# Patient Record
Sex: Female | Born: 1990 | Hispanic: No | Marital: Single | Smoking: Never smoker
Health system: Southern US, Community
[De-identification: ages and names within clinical notes are randomized; demographics above are authoritative.]

## PROBLEM LIST (undated history)

## (undated) ENCOUNTER — Inpatient Hospital Stay (HOSPITAL_COMMUNITY): Payer: Self-pay

## (undated) DIAGNOSIS — R519 Headache, unspecified: Secondary | ICD-10-CM

## (undated) DIAGNOSIS — J45909 Unspecified asthma, uncomplicated: Secondary | ICD-10-CM

## (undated) DIAGNOSIS — R51 Headache: Secondary | ICD-10-CM

## (undated) HISTORY — DX: Headache, unspecified: R51.9

## (undated) HISTORY — DX: Headache: R51

## (undated) HISTORY — PX: NO PAST SURGERIES: SHX2092

---

## 2014-12-02 LAB — OB RESULTS CONSOLE ABO/RH: RH Type: POSITIVE

## 2014-12-02 LAB — OB RESULTS CONSOLE GC/CHLAMYDIA
Chlamydia: NEGATIVE
Gonorrhea: NEGATIVE

## 2014-12-02 LAB — OB RESULTS CONSOLE HEPATITIS B SURFACE ANTIGEN: HEP B S AG: NEGATIVE

## 2014-12-02 LAB — OB RESULTS CONSOLE RUBELLA ANTIBODY, IGM: Rubella: IMMUNE

## 2014-12-02 LAB — OB RESULTS CONSOLE HIV ANTIBODY (ROUTINE TESTING): HIV: NONREACTIVE

## 2014-12-02 LAB — OB RESULTS CONSOLE ANTIBODY SCREEN: Antibody Screen: NEGATIVE

## 2014-12-02 LAB — OB RESULTS CONSOLE RPR: RPR: NONREACTIVE

## 2014-12-03 ENCOUNTER — Encounter (HOSPITAL_COMMUNITY): Payer: Self-pay | Admitting: *Deleted

## 2014-12-03 ENCOUNTER — Inpatient Hospital Stay (HOSPITAL_COMMUNITY)
Admission: AD | Admit: 2014-12-03 | Discharge: 2014-12-03 | Disposition: A | Discharge status: Home / Self Care | Payer: Medicaid Other | Source: Ambulatory Visit | Attending: Obstetrics & Gynecology | Admitting: Obstetrics & Gynecology

## 2014-12-03 DIAGNOSIS — N949 Unspecified condition associated with female genital organs and menstrual cycle: Secondary | ICD-10-CM

## 2014-12-03 DIAGNOSIS — O26892 Other specified pregnancy related conditions, second trimester: Secondary | ICD-10-CM | POA: Diagnosis not present

## 2014-12-03 DIAGNOSIS — Z3A15 15 weeks gestation of pregnancy: Secondary | ICD-10-CM | POA: Insufficient documentation

## 2014-12-03 DIAGNOSIS — M549 Dorsalgia, unspecified: Secondary | ICD-10-CM | POA: Diagnosis present

## 2014-12-03 DIAGNOSIS — R102 Pelvic and perineal pain: Secondary | ICD-10-CM | POA: Diagnosis not present

## 2014-12-03 DIAGNOSIS — R1030 Lower abdominal pain, unspecified: Secondary | ICD-10-CM | POA: Insufficient documentation

## 2014-12-03 HISTORY — DX: Unspecified asthma, uncomplicated: J45.909

## 2014-12-03 LAB — URINALYSIS, ROUTINE W REFLEX MICROSCOPIC
BILIRUBIN URINE: NEGATIVE
Glucose, UA: NEGATIVE mg/dL
Hgb urine dipstick: NEGATIVE
Ketones, ur: NEGATIVE mg/dL
Leukocytes, UA: NEGATIVE
NITRITE: NEGATIVE
PH: 5.5 (ref 5.0–8.0)
Protein, ur: NEGATIVE mg/dL
SPECIFIC GRAVITY, URINE: 1.01 (ref 1.005–1.030)
Urobilinogen, UA: 0.2 mg/dL (ref 0.0–1.0)

## 2014-12-03 LAB — COMPREHENSIVE METABOLIC PANEL
ALK PHOS: 45 U/L (ref 38–126)
ALT: 16 U/L (ref 14–54)
AST: 24 U/L (ref 15–41)
Albumin: 3.7 g/dL (ref 3.5–5.0)
Anion gap: 6 (ref 5–15)
BILIRUBIN TOTAL: 0.5 mg/dL (ref 0.3–1.2)
BUN: 8 mg/dL (ref 6–20)
CALCIUM: 9.3 mg/dL (ref 8.9–10.3)
CO2: 24 mmol/L (ref 22–32)
CREATININE: 0.47 mg/dL (ref 0.44–1.00)
Chloride: 105 mmol/L (ref 101–111)
Glucose, Bld: 100 mg/dL — ABNORMAL HIGH (ref 65–99)
Potassium: 3.7 mmol/L (ref 3.5–5.1)
Sodium: 135 mmol/L (ref 135–145)
TOTAL PROTEIN: 7.6 g/dL (ref 6.5–8.1)

## 2014-12-03 LAB — CBC
HCT: 30.8 % — ABNORMAL LOW (ref 36.0–46.0)
HEMOGLOBIN: 10.9 g/dL — AB (ref 12.0–15.0)
MCH: 31.3 pg (ref 26.0–34.0)
MCHC: 35.4 g/dL (ref 30.0–36.0)
MCV: 88.5 fL (ref 78.0–100.0)
PLATELETS: 258 10*3/uL (ref 150–400)
RBC: 3.48 MIL/uL — AB (ref 3.87–5.11)
RDW: 13.1 % (ref 11.5–15.5)
WBC: 9.7 10*3/uL (ref 4.0–10.5)

## 2014-12-03 LAB — AMYLASE: AMYLASE: 66 U/L (ref 28–100)

## 2014-12-03 LAB — LIPASE, BLOOD: LIPASE: 26 U/L (ref 22–51)

## 2014-12-03 MED ORDER — ACETAMINOPHEN 500 MG PO TABS
1000.0000 mg | ORAL_TABLET | Freq: Once | ORAL | Status: AC
  Administered 2014-12-03: 1000 mg via ORAL
  Filled 2014-12-03: qty 2

## 2014-12-03 NOTE — Discharge Instructions (Signed)

## 2014-12-03 NOTE — MAU Note (Addendum)
PT SAYS SHE WENT  [facility]  FOR POSITIVE  TEST.     EDC- BASED ON CYCLE -   05-30-2015.    CRAMPING STARTED  LAST NIGHT - LOWER ABD  AND RADIATE  INTO LEFT RIB CAGE-   TOOK 2 XS TYLENOL -    FELL ASLEEP.      THEN NO PAIN      THEN  THIS AM  - NAUSEA.  ALL DAY  ABD  TIGHT .     THEN AT 7PM TONIGHT  PAIN RADIATE  TO  RIGHT  RIB CAGE.  THEN   RADIATE  TO SPINE.          WAS SEEN  AT HD IN [place]   YESTERDAY- ALL OK .  THEN RETURNS ON Monday- .    LAST SEX-  TODAY 5PM-  NO PAIN      SAYS VOMITED  X2  TONIGHT.     FEELS LIKE HEART   IS FLUTTERING

## 2014-12-03 NOTE — MAU Provider Note (Signed)
History     CSN: [id number]  Arrival date and time: 12/03/14 2124   First Provider Initiated Contact with Patient 12/03/14 2206      Chief Complaint  Patient presents with  . Abdominal Cramping   HPI  Ms. Margaret Logan is a 24 y.o. G3P1011 at [redacted]w[redacted]d who presents to MAU today with complaint of abdominal and back pain since last night. She states that she took Tylenol yesterday which helped somewhat and she was able to sleep. This morning she felt mild discomfort, but feels that the pain is worse tonight. She states lower abdominal pain that radiates to the RUQ with sharp pains. She also states low back pain that radiates to the mid-thoracic region, worse on the right then left. She rates pain at 6/10 now. She has not taken anything for pain today. She denies complications with this or previous pregnancies. She denies vaginal bleeding, diarrhea, constipation, fever or UTI symptoms. She has had intermittent N/V which as improved since [redacted] weeks GA. She is not nauseous currently. She has just recently started prenatal care with [facility] in [facility].   OB History    Gravida Para Term Preterm AB TAB SAB Ectopic Multiple Living   Past Medical History  Diagnosis Date  . Asthma     uses an inhaler prn, last use about a year ago    Past Surgical History  Procedure Laterality Date  . No past surgeries      No family history on file.  Social History  Substance Use Topics  . Smoking status: None  . Smokeless tobacco: None  . Alcohol Use: None    Allergies:  Allergies  Allergen Reactions  . Penicillins Nausea And Vomiting  . Latex Swelling and Rash    No prescriptions prior to admission    Review of Systems  Constitutional: Negative for fever and malaise/fatigue.  Gastrointestinal: Positive for nausea, vomiting and abdominal pain. Negative for diarrhea and constipation.  Genitourinary: Negative for dysuria, urgency and frequency.       Neg - vaginal  bleeding, abnormal discharge   Physical Exam   Blood pressure 132/71, pulse 111, temperature 99.2 F (37.3 C), temperature source Oral, height  (1.6 m), weight 157 lb (71.215 kg), last menstrual period 08/14/2014, SpO2 100 %.  Physical Exam  Nursing note and vitals reviewed. Constitutional: She is oriented to person, place, and time. She appears well-developed and well-nourished. No distress.  HENT:  Head: Normocephalic and atraumatic.  Cardiovascular: Normal rate.   Respiratory: Effort normal.  GI: Soft. Bowel sounds are normal. She exhibits no distension and no mass. There is no tenderness. There is no rebound and no guarding.  Neurological: She is alert and oriented to person, place, and time.  Skin: Skin is warm and dry. No erythema.  Psychiatric: She has a normal mood and affect.  Dilation: Closed Effacement (%): Thick Cervical Position: Posterior Exam by:: Vonzella Nipple PA  Results for orders placed or performed during the hospital encounter of 12/03/14 (from the past 24 hour(s))  Urinalysis, Routine w reflex microscopic (not at [facility])     Status: None   Collection Time: 12/03/14  9:30 PM  Result Value Ref Range   Color, Urine YELLOW YELLOW   APPearance CLEAR CLEAR   Specific Gravity, Urine 1.010 1.005 - 1.030   pH 5.5 5.0 - 8.0   Glucose, UA NEGATIVE NEGATIVE mg/dL   Hgb  urine dipstick NEGATIVE NEGATIVE   Bilirubin Urine NEGATIVE NEGATIVE   Ketones, ur NEGATIVE NEGATIVE mg/dL   Protein, ur NEGATIVE NEGATIVE mg/dL   Urobilinogen, UA 0.2 0.0 - 1.0 mg/dL   Nitrite NEGATIVE NEGATIVE   Leukocytes, UA NEGATIVE NEGATIVE    MAU Course  Procedures None  MDM FHR - 149 bpm with doppler UA, CBC and CMP today Tylenol 1000 mg for pain given in MAU. Patient declines use of anything stronger than Tylenol.  Medication for N/V offered. Patient declines.  Assessment and Plan  A: SIUP at [redacted]w[redacted]d Round ligament pain Back pain in pregnancy, second trimester  P: Discharge  home Tylenol PRN for pain advised Second trimester warning signs discussed Discussed use of warm bath/shower and abdominal binder for pain Patient advised to follow-up with [facility] [place] as scheduled for routine prenatal care Patient may return to MAU as needed or if her condition were to change or worsen   Marny Lowenstein, PA-C  12/03/2014, 10:06 PM

## 2014-12-23 ENCOUNTER — Other Ambulatory Visit (HOSPITAL_COMMUNITY): Payer: Self-pay | Admitting: Obstetrics and Gynecology

## 2014-12-23 DIAGNOSIS — Z3689 Encounter for other specified antenatal screening: Secondary | ICD-10-CM

## 2014-12-24 ENCOUNTER — Ambulatory Visit (HOSPITAL_COMMUNITY)
Admission: RE | Admit: 2014-12-24 | Discharge: 2014-12-24 | Disposition: A | Discharge status: Home / Self Care | Payer: Medicaid Other | Source: Ambulatory Visit | Attending: Obstetrics and Gynecology | Admitting: Obstetrics and Gynecology

## 2014-12-24 ENCOUNTER — Other Ambulatory Visit (HOSPITAL_COMMUNITY): Payer: Self-pay | Admitting: Obstetrics and Gynecology

## 2014-12-24 DIAGNOSIS — Z3A18 18 weeks gestation of pregnancy: Secondary | ICD-10-CM | POA: Diagnosis not present

## 2014-12-24 DIAGNOSIS — Z3689 Encounter for other specified antenatal screening: Secondary | ICD-10-CM

## 2014-12-24 DIAGNOSIS — Z36 Encounter for antenatal screening of mother: Secondary | ICD-10-CM | POA: Diagnosis not present

## 2015-02-11 ENCOUNTER — Other Ambulatory Visit (HOSPITAL_COMMUNITY): Payer: Self-pay | Admitting: Nurse Practitioner

## 2015-02-11 DIAGNOSIS — Z3689 Encounter for other specified antenatal screening: Secondary | ICD-10-CM

## 2015-03-02 NOTE — L&D Delivery Note (Signed)
Delivery Note 2340: Nurse call reports patient C/C/+2 with urge to push.  In room to assess and patient states "I am ready."  FHR reassuring and neonatal team at bedside for MSAF and maternal fever.  Patient delivered as below with staff and FOB support.   At 12:04 AM, on May 29, 2015, a viable female "Margaret Logan" was delivered via  (Presentation: Left Occiput Anterior with manual restitution to LOT). After delivery of head a shoulder dystocia was noted and McRoberts and Suprapubric pressure maneuvers were implemented.  After ~2 minutes of attempting these maneuvers, with adjustments to suprapubic pressure and attempts at delivery of posterior arm, assistance was called.  Dr. Sherre Scarlet. Harraway-Smith in room  and at perineum ~30 seconds later. McRoberts and suprapubic given per her instructions with release of anterior shoulder at ~3 minutes from delivery of head.  After release of shoulder, care reassumed, by this provider, and body delivered.  Infant placed on mother's abdomen where cord was immediately cut and infant handed to Dr. Laverta BaltimoreB. Rattray for assessment.  Infant APGAR: 2,9. Provider then discussed shoulder dystocia incident with patient and SO to their satisfaction, addressing all questions and concerns.  Cord pH and blood collected and sent. Placenta delivered spontaneously and noted to be intact with 3VC upon inspection.  Vaginal inspection revealed no lacerations.  Fundus firm, at the umbilicus, and bleeding small.  Mother hemodynamically stable and infant skin to skin prior to provider exit.  Mother unsure of birth control method and opts to breastfeed.  Infant weight at one hour of life: 8lbs 2.9oz, 20.5in    Results for Margaret CoyBROOKS, GIRL Margaret (MRN [id number])   Ref. Range 05/29/2015 00:05  pH cord blood Unknown 7.353  pCO2 cord blood Latest Units: mmHg 40.5  Bicarbonate Latest Ref Range: 20.0-24.0 mEq/L 21.9  TCO2 Latest Ref Range: 0-100 mmol/L 23.2  Acid-base deficit Latest Ref Range: 0.0-2.0 mmol/L 2.9  (H)   Anesthesia: Epidural  Episiotomy: None Lacerations: None Suture Repair: None Est. Blood Loss (mL): 200  Mom to postpartum.  Baby to Couplet care / Skin to Skin.  Cherre RobinsJessica L Esmae Donathan MSN, CNM 05/29/2015, 12:37 AM

## 2015-03-11 ENCOUNTER — Ambulatory Visit (HOSPITAL_COMMUNITY)
Admission: RE | Admit: 2015-03-11 | Discharge: 2015-03-11 | Disposition: A | Discharge status: Home / Self Care | Payer: Medicaid Other | Source: Ambulatory Visit | Attending: Nurse Practitioner | Admitting: Nurse Practitioner

## 2015-03-11 ENCOUNTER — Other Ambulatory Visit (HOSPITAL_COMMUNITY): Payer: Self-pay | Admitting: Nurse Practitioner

## 2015-03-11 DIAGNOSIS — O359XX0 Maternal care for (suspected) fetal abnormality and damage, unspecified, not applicable or unspecified: Secondary | ICD-10-CM | POA: Insufficient documentation

## 2015-03-11 DIAGNOSIS — Z3A29 29 weeks gestation of pregnancy: Secondary | ICD-10-CM | POA: Insufficient documentation

## 2015-03-11 DIAGNOSIS — Z3689 Encounter for other specified antenatal screening: Secondary | ICD-10-CM

## 2015-05-26 ENCOUNTER — Encounter (HOSPITAL_COMMUNITY): Payer: Self-pay | Admitting: *Deleted

## 2015-05-26 ENCOUNTER — Telehealth (HOSPITAL_COMMUNITY): Payer: Self-pay | Admitting: *Deleted

## 2015-05-26 LAB — OB RESULTS CONSOLE GBS: GBS: NEGATIVE

## 2015-05-26 NOTE — Telephone Encounter (Signed)
Preadmission screen  

## 2015-05-27 ENCOUNTER — Other Ambulatory Visit: Payer: Self-pay | Admitting: Obstetrics and Gynecology

## 2015-05-28 ENCOUNTER — Encounter (HOSPITAL_COMMUNITY): Payer: Self-pay

## 2015-05-28 ENCOUNTER — Inpatient Hospital Stay (HOSPITAL_COMMUNITY): Payer: Medicaid Other | Admitting: Anesthesiology

## 2015-05-28 ENCOUNTER — Inpatient Hospital Stay (HOSPITAL_COMMUNITY)
Admission: RE | Admit: 2015-05-28 | Discharge: 2015-05-31 | DRG: 774 | Disposition: A | Discharge status: Home / Self Care | Payer: Medicaid Other | Source: Ambulatory Visit | Attending: Obstetrics and Gynecology | Admitting: Obstetrics and Gynecology

## 2015-05-28 DIAGNOSIS — Z8249 Family history of ischemic heart disease and other diseases of the circulatory system: Secondary | ICD-10-CM

## 2015-05-28 DIAGNOSIS — J45909 Unspecified asthma, uncomplicated: Secondary | ICD-10-CM | POA: Diagnosis present

## 2015-05-28 DIAGNOSIS — O358XX0 Maternal care for other (suspected) fetal abnormality and damage, not applicable or unspecified: Secondary | ICD-10-CM | POA: Diagnosis present

## 2015-05-28 DIAGNOSIS — Z91040 Latex allergy status: Secondary | ICD-10-CM

## 2015-05-28 DIAGNOSIS — Z3A41 41 weeks gestation of pregnancy: Secondary | ICD-10-CM

## 2015-05-28 DIAGNOSIS — O9952 Diseases of the respiratory system complicating childbirth: Secondary | ICD-10-CM | POA: Diagnosis present

## 2015-05-28 DIAGNOSIS — Z880 Allergy status to penicillin: Secondary | ICD-10-CM | POA: Diagnosis not present

## 2015-05-28 DIAGNOSIS — O480 Post-term pregnancy: Secondary | ICD-10-CM | POA: Diagnosis present

## 2015-05-28 DIAGNOSIS — O9081 Anemia of the puerperium: Secondary | ICD-10-CM | POA: Diagnosis not present

## 2015-05-28 DIAGNOSIS — O35EXX0 Maternal care for other (suspected) fetal abnormality and damage, fetal genitourinary anomalies, not applicable or unspecified: Secondary | ICD-10-CM

## 2015-05-28 DIAGNOSIS — D62 Acute posthemorrhagic anemia: Secondary | ICD-10-CM | POA: Diagnosis not present

## 2015-05-28 LAB — CBC
HCT: 34.5 % — ABNORMAL LOW (ref 36.0–46.0)
Hemoglobin: 11.4 g/dL — ABNORMAL LOW (ref 12.0–15.0)
MCH: 27.1 pg (ref 26.0–34.0)
MCHC: 33 g/dL (ref 30.0–36.0)
MCV: 81.9 fL (ref 78.0–100.0)
PLATELETS: 244 10*3/uL (ref 150–400)
RBC: 4.21 MIL/uL (ref 3.87–5.11)
RDW: 14.8 % (ref 11.5–15.5)
WBC: 10.3 10*3/uL (ref 4.0–10.5)

## 2015-05-28 LAB — RAPID HIV SCREEN (HIV 1/2 AB+AG)
HIV 1/2 Antibodies: NONREACTIVE
HIV-1 P24 ANTIGEN - HIV24: NONREACTIVE

## 2015-05-28 LAB — TYPE AND SCREEN
ABO/RH(D): A POS
Antibody Screen: NEGATIVE

## 2015-05-28 LAB — ABO/RH: ABO/RH(D): A POS

## 2015-05-28 LAB — RPR: RPR Ser Ql: NONREACTIVE

## 2015-05-28 MED ORDER — OXYTOCIN 10 UNIT/ML IJ SOLN
2.5000 [IU]/h | INTRAVENOUS | Status: DC
  Filled 2015-05-28: qty 10

## 2015-05-28 MED ORDER — TERBUTALINE SULFATE 1 MG/ML IJ SOLN
0.2500 mg | Freq: Once | INTRAMUSCULAR | Status: DC | PRN
  Filled 2015-05-28: qty 1

## 2015-05-28 MED ORDER — PHENYLEPHRINE 40 MCG/ML (10ML) SYRINGE FOR IV PUSH (FOR BLOOD PRESSURE SUPPORT)
80.0000 ug | PREFILLED_SYRINGE | INTRAVENOUS | Status: DC | PRN
  Filled 2015-05-28: qty 2

## 2015-05-28 MED ORDER — DIPHENHYDRAMINE HCL 50 MG/ML IJ SOLN: 12.5000 mg | INTRAMUSCULAR | Status: DC | PRN

## 2015-05-28 MED ORDER — ONDANSETRON HCL 4 MG/2ML IJ SOLN: 4.0000 mg | Freq: Four times a day (QID) | INTRAMUSCULAR | Status: DC | PRN

## 2015-05-28 MED ORDER — CITRIC ACID-SODIUM CITRATE 334-500 MG/5ML PO SOLN: 30.0000 mL | ORAL | Status: DC | PRN

## 2015-05-28 MED ORDER — LACTATED RINGERS IV SOLN: 500.0000 mL | Freq: Once | INTRAVENOUS | Status: DC

## 2015-05-28 MED ORDER — LACTATED RINGERS IV SOLN
INTRAVENOUS | Status: DC
  Administered 2015-05-28: 300 mL via INTRAUTERINE

## 2015-05-28 MED ORDER — OXYTOCIN BOLUS FROM INFUSION: 500.0000 mL | INTRAVENOUS | Status: DC

## 2015-05-28 MED ORDER — MISOPROSTOL 25 MCG QUARTER TABLET
25.0000 ug | ORAL_TABLET | ORAL | Status: DC | PRN
  Filled 2015-05-28: qty 1
  Filled 2015-05-28: qty 0.25

## 2015-05-28 MED ORDER — LACTATED RINGERS IV SOLN: 500.0000 mL | INTRAVENOUS | Status: DC | PRN

## 2015-05-28 MED ORDER — LIDOCAINE HCL (PF) 1 % IJ SOLN
INTRAMUSCULAR | Status: DC | PRN
  Administered 2015-05-28: 3 mL
  Administered 2015-05-28: 2 mL via EPIDURAL
  Administered 2015-05-28: 5 mL

## 2015-05-28 MED ORDER — EPHEDRINE 5 MG/ML INJ: 10.0000 mg | INTRAVENOUS | Status: DC | PRN

## 2015-05-28 MED ORDER — LACTATED RINGERS IV SOLN
INTRAVENOUS | Status: DC
  Administered 2015-05-28: 18:00:00 via INTRAVENOUS
  Administered 2015-05-28: 125 mL/h via INTRAVENOUS

## 2015-05-28 MED ORDER — SODIUM CHLORIDE 0.9 % IV SOLN
3.0000 g | Freq: Four times a day (QID) | INTRAVENOUS | Status: DC
  Administered 2015-05-28: 3 g via INTRAVENOUS
  Filled 2015-05-28 (×3): qty 3

## 2015-05-28 MED ORDER — EPHEDRINE 5 MG/ML INJ
10.0000 mg | INTRAVENOUS | Status: DC | PRN
  Filled 2015-05-28: qty 2

## 2015-05-28 MED ORDER — LIDOCAINE HCL (PF) 1 % IJ SOLN
30.0000 mL | INTRAMUSCULAR | Status: DC | PRN
  Filled 2015-05-28: qty 30

## 2015-05-28 MED ORDER — FENTANYL 2.5 MCG/ML BUPIVACAINE 1/10 % EPIDURAL INFUSION (WH - ANES)
14.0000 mL/h | INTRAMUSCULAR | Status: DC | PRN
  Administered 2015-05-28 (×2): 14 mL/h via EPIDURAL
  Filled 2015-05-28 (×2): qty 125

## 2015-05-28 MED ORDER — PHENYLEPHRINE 40 MCG/ML (10ML) SYRINGE FOR IV PUSH (FOR BLOOD PRESSURE SUPPORT): 80.0000 ug | PREFILLED_SYRINGE | INTRAVENOUS | Status: DC | PRN

## 2015-05-28 MED ORDER — ACETAMINOPHEN 325 MG PO TABS: 650.0000 mg | ORAL_TABLET | ORAL | Status: DC | PRN

## 2015-05-28 MED ORDER — PHENYLEPHRINE 40 MCG/ML (10ML) SYRINGE FOR IV PUSH (FOR BLOOD PRESSURE SUPPORT)
80.0000 ug | PREFILLED_SYRINGE | INTRAVENOUS | Status: DC | PRN
  Filled 2015-05-28: qty 2
  Filled 2015-05-28: qty 20

## 2015-05-28 MED ORDER — OXYTOCIN 10 UNIT/ML IJ SOLN
1.0000 m[IU]/min | INTRAVENOUS | Status: DC
  Administered 2015-05-28: 2 m[IU]/min via INTRAVENOUS

## 2015-05-28 MED ORDER — ACETAMINOPHEN 500 MG PO TABS
1000.0000 mg | ORAL_TABLET | Freq: Once | ORAL | Status: AC
  Administered 2015-05-28: 1000 mg via ORAL
  Filled 2015-05-28: qty 2

## 2015-05-28 MED ORDER — LACTATED RINGERS IV SOLN
500.0000 mL | Freq: Once | INTRAVENOUS | Status: AC
  Administered 2015-05-28: 1000 mL via INTRAVENOUS

## 2015-05-28 MED ORDER — FENTANYL CITRATE (PF) 100 MCG/2ML IJ SOLN
50.0000 ug | INTRAMUSCULAR | Status: DC | PRN
  Administered 2015-05-28: 50 ug via INTRAVENOUS
  Filled 2015-05-28: qty 2

## 2015-05-28 MED ORDER — TERBUTALINE SULFATE 1 MG/ML IJ SOLN: 0.2500 mg | Freq: Once | INTRAMUSCULAR | Status: DC | PRN

## 2015-05-28 NOTE — Progress Notes (Signed)
Dr Kern Albertaurks assessing epidural and to redose pt as needed.

## 2015-05-28 NOTE — Progress Notes (Signed)
Keane PoliceHailey Insalaco [id number]  Subjective: Strip and Chart Reviewed.  Objective:  Filed Vitals:   05/28/15 0509 05/28/15 0531 05/28/15 0601 05/28/15 0639  BP: 127/82 129/70 106/58 126/82  Pulse: 95 73 95 91  Temp: 98.8 F (37.1 C)     TempSrc: Oral     Resp:      Height:      Weight:        FHR: 125 bpm, Mod Var, -Decels, +Accels UC: Q4-415min  Assessment: IUP at 6763w0d Cat I FT Post Dates IOL; Foley Bulb and Pitocin  Plan: -Continue present mgmt -Dr. ND updated -Report to be given to V.Standard, CNM  Sabas SousJ. Markesia Crilly, CNM 05/28/2015 6:49 AM

## 2015-05-28 NOTE — Progress Notes (Addendum)
Margaret Logan MRN: [id number]  Subjective: -Care assumed.  Patient resting in bed.  Reports fatigue.  No rectal pressure. Nurse reports patient with increasing temp.  FOB remains at bedside, supportive.   Objective: BP 94/53 mmHg  Pulse 122  Temp(Src) 99.4 F (37.4 C) (Oral)  Resp 18  Ht 5' 3.5" (1.613 m)  Wt 88.451 kg (195 lb)  BMI 34.00 kg/m2  SpO2 100%  LMP 08/14/2014      Fetal Monitoring: FHT: 155 bpm, Min Var, -Decels, -Accels UC: Q3-414min    Vaginal Exam: SVE:   Dilation: 8 Effacement (%): 80 Station: -1 Exam by:: Margaret Logan CNM Membranes:SROM at 1600 Internal Monitors: IUPC inserted without difficulty  Augmentation/Induction: Pitocin:6912mUn/min  Cytotec: None S/P Foley Bulb  Assessment:  IUP at 41wks Cat I FT Pitocin Induction Progressive Labor Mild Fever  Plan: -Discussed IUPC r/b, prior to insertion, including increased risk of infection and ability to adequately monitor quantity and strength of contractions. -Start amnioinfusion at 300/150 to promote variability -Give 1gram tylenol po now -Position change to promote fetal descent and rotation -Continue other mgmt as ordered  Margaret CavaJessica L Daesean Lazarz,MSN, CNM 05/28/2015, 8:10 PM   Addendum 2110 Patient reports feeling better Temp 100.8 Maternal Fever Start Unasyn x 24 hrs Margaret Logan updated on patient status  Margaret Logan 9:12 PM

## 2015-05-28 NOTE — Progress Notes (Addendum)
Labor Progress  Subjective: Managing ctx with nitrous. Reports its no longer working ask for epidural  Objective: BP 127/64 mmHg  Pulse 84  Temp(Src) 97.9 F (36.6 C) (Oral)  Resp 18  Ht 5' 3.5" (1.613 m)  Wt 195 lb (88.451 kg)  BMI 34.00 kg/m2  LMP 08/14/2014     FHT: 135, moderate variability + accel, occasional variable  decel CTX:  regular, every 3-4 minutes Uterus gravid, soft non tender SVE:  Dilation: 5.5 Effacement (%): 80 Station: -2 Exam by:: Stclair Szymborski CNM Pitocin at 688mUn/min  Assessment:  IUP at 41.0 weeks IOL d/t PD NICHD: Category 2 Membranes:  BBW  Induction:   Cytotec xn/a  Foley Bulb: out 0640  Pitocin - 8mu  Pain management:  IV pain management: n/a  Nitrous: yes Epidural placement: request GBS neg  Plan: Continue labor plan Continuous monitoring Frequent position changes to facilitate fetal rotation and descent. Will reassess with cervical exam at 1600 or earlier if necessary Possible AROM at next check Continue pitocin per protocol      Margaret Logan, CNM, MSN 05/28/2015. 10:38 AM

## 2015-05-28 NOTE — Progress Notes (Signed)
Labor Progress  Subjective: On the right side with pillow between leg.  Pt coping with ctx well with nitrous.  FOB at the bedside.  Decline AROM at this time.  Objective: BP 105/50 mmHg  Pulse 83  Temp(Src) 97.9 F (36.6 C) (Oral)  Resp 18  Ht 5' 3.5" (1.613 m)  Wt 195 lb (88.451 kg)  BMI 34.00 kg/m2  LMP 08/14/2014     FHT: 130, moderate variability, + accel, no decel CTX:  regular, every 2-4 minutes Uterus gravid, soft non tender SVE:  Dilation: 6 Effacement (%): 80 Station: -2 Exam by:: katie forsell,rnc Pitocin at 32mUn/min  Assessment:  IUP at 41.0 weeks IOL d/t PD NICHD: Category 1 Membranes:  intact  Induction:   Cytotec xo  Foley Bulb: out 0640  Pitocin - yes  Pain management:  IV pain management:  Epidural placement: decline  Nitrous: in use GBS negative  Plan: Continue labor plan Continuous monitoring Peanut ball Frequent position changes to facilitate fetal rotation and descent. Will reassess with cervical exam at 1200 or earlier if necessary Continue pitocin per protocol      Becky Berberian, CNM, MSN 05/28/2015. 9:10 AM

## 2015-05-28 NOTE — Progress Notes (Signed)
Labor Progress  Subjective: very uncomfortable with ctx, c/o increase back pain. nitrous helping for now   Objective: BP 109/70 mmHg  Pulse 120  Temp(Src) 98.2 F (36.8 C) (Oral)  Resp 20  Ht 5' 3.5" (1.613 m)  Wt 195 lb (88.451 kg)  BMI 34.00 kg/m2  SpO2 100%  LMP 08/14/2014     FHT: 130, moderate variabilit, +accel, no decel. CTX:  regular, every 3-5 minutes Uterus gravid, soft non tender SVE:  4-5/70-3 Pitocin at 196mUn/min  Assessment:  IUP at 41.0 weeks IOL d/t PD NICHD: Category 1 Membranes:  BBW  Induction:   Foley Bulb: yes  Pitocin - 6mu  Pain management:   Nitrous:   GBS neagtive  Plan: Continue labor plan Continuous monitoring Rest/Ambulate Frequent position changes to facilitate fetal rotation and descent. Will reassess with cervical exam at 1400or earlier if necessary Continue pitocin per protocol      Margaret Logan, CNM, MSN 05/28/2015. 4:29 PM

## 2015-05-28 NOTE — Progress Notes (Signed)
Labor Progress  Subjective: Felling much better with epidural, feeling more pressure with each ctx  Objective: BP 125/69 mmHg  Pulse 104  Temp(Src) 98.2 F (36.8 C) (Oral)  Resp 20  Ht 5' 3.5" (1.613 m)  Wt 195 lb (88.451 kg)  BMI 34.00 kg/m2  SpO2 100%  LMP 08/14/2014     FHT: 140, moderate variability, + accel, no decel CTX:  regular, every 3-4 minutes Uterus gravid, soft non tender SVE:  Dilation: 6 Effacement (%): 80 Station: -2 Exam by:: Maisee Vollman CNM Pitocin at 1008mUn/min  Assessment:  IUP at 41.0 weeks IOL d/t PD NICHD: Category 1 Membranes:  SROM during VE, lite mec   Induction:   Foley Bulb: yes  Pitocin - 8mu  Pain management:  IV pain management:   Nitrous: SP Epidural placement: Yes GBS negative  Plan: Continue labor plan Continuous monitoring Rest Frequent position changes to facilitate fetal rotation and descent. Will reassess with cervical exam at 1800 or earlier if necessary Continue pitocin per protocol      Nanette Wirsing, CNM, MSN 05/28/2015. 4:40 PM

## 2015-05-28 NOTE — Anesthesia Preprocedure Evaluation (Signed)
Anesthesia Evaluation  Patient identified by MRN, date of birth, ID band Patient awake    Reviewed: Allergy & Precautions, Patient's Chart, lab work & pertinent test results  Airway Mallampati: II       Dental   Pulmonary asthma ,    Pulmonary exam normal        Cardiovascular negative cardio ROS Normal cardiovascular exam     Neuro/Psych negative neurological ROS     GI/Hepatic negative GI ROS, Neg liver ROS,   Endo/Other  negative endocrine ROS  Renal/GU negative Renal ROS     Musculoskeletal   Abdominal   Peds  Hematology negative hematology ROS (+)   Anesthesia Other Findings   Reproductive/Obstetrics (+) Pregnancy                             Lab Results  Component Value Date   WBC 10.3 05/28/2015   HGB 11.4* 05/28/2015   HCT 34.5* 05/28/2015   MCV 81.9 05/28/2015   PLT 244 05/28/2015    Anesthesia Physical Anesthesia Plan  ASA: II  Anesthesia Plan: Epidural   Post-op Pain Management:    Induction:   Airway Management Planned:   Additional Equipment:   Intra-op Plan:   Post-operative Plan:   Informed Consent: I have reviewed the patients History and Physical, chart, labs and discussed the procedure including the risks, benefits and alternatives for the proposed anesthesia with the patient or authorized representative who has indicated his/her understanding and acceptance.     Plan Discussed with:   Anesthesia Plan Comments:         Anesthesia Quick Evaluation

## 2015-05-28 NOTE — H&P (Signed)
Margaret Logan is a 25 y.o. female, G3P1011 at 941 weeks, presenting for IOL secondary to postdates.  Patient pregnancy significant for fetal abnormalities noted in kidneys and urinary tract.  Patient personal history significant for asthma.  Patient is GBS negative and does not desire epidural for pain mgmt.  Patient also declined influenza and Tdap vaccinations.    Patient Active Problem List   Diagnosis Date Noted  . Post-dates pregnancy 05/28/2015    History of present pregnancy: [facility], transferred to [facility] at 31wks.   EDC of 05/21/2015 was established by 18.4wk US on 12/24/2014.   Anatomy scan:  18.4 weeks, with abnormal fetal kidney findings and an posterior placenta.   Additional US evaluations:  29.6wks: US shows continued dilation of Left urinary tract with enlargement of both the renal pelvis and several calyces.  33wks: F/U ON DILATION OF FETAL KIDNEYS. EFW 2349 GM, 46%IL, AFI 45%ILE, 13.82, VTX, POSTERIOR PLACENTA. LEFT KIDNEY PLELECTASIS 0.71 CM, CALIECTASIS ALSO SEEN. RIGHT KIDNEY WNL.  38.1wks: U/S: Singleton pregnancy. Vertex presentation. Cervix not seen per protocol. AFI 30th%. EFW 3552g 7lb 12oz 86th% Pyelectasis of the left kidney measures 1.2 cm. Caliectasis involvement. Small amount of renal cortical thinning noted. No uretic dilation seen. Rt kidney appears normal. 40.6wks: BPP 8/8, AFI 14.97, fhr 130, vertex, posterior placenta. Significant prenatal events: 1st Trimester: 2nd Trimester: [facility]. C/O edema, spotty vision, HA, and back pain. MFM consult for fetal kidney abnormalities 3rd Trimester:  Patient c/o abdominal pain that was treated with heat and lidocaine patch.  Patient call and reports "panic attack."  Patient reports continued abdominal cramping and irregular contractions Last evaluation:  05/27/2015 in office by V. Standard, CNM. FHR 146, VE 1/50/-3, BP 100/60, Wt 196lbs  OB History    Gravida Para Term Preterm  AB TAB SAB Ectopic Multiple Living   3 1 1  1  1   1     04/2010: NSVD with Epidural at 42 wks: Female at 6lbs 5oz  2016: SAB   Past Medical History  Diagnosis Date  . Asthma     uses an inhaler prn, last use about a year ago   Past Surgical History  Procedure Laterality Date  . No past surgeries     Family History: family history includes Hypertension in her maternal grandmother and mother. There is no history of Alcohol abuse, Arthritis, Asthma, Birth defects, Cancer, COPD, Depression, Diabetes, Drug abuse, Early death, Hearing loss, Heart disease, Hyperlipidemia, Kidney disease, Learning disabilities, Mental illness, Mental retardation, Miscarriages / Stillbirths, Stroke, Vision loss, or Varicose Veins. Social History:  reports that she has never smoked. She has never used smokeless tobacco. She reports that she does not drink alcohol or use illicit drugs.   Prenatal Transfer Tool  Maternal Diabetes: No Genetic Screening: Normal Maternal Ultrasounds/Referrals: Abnormal:  Findings:   Fetal Kidney Anomalies Fetal Ultrasounds or other Referrals:  Referred to Materal Fetal Medicine  Maternal Substance Abuse:  No Significant Maternal Medications:  None Significant Maternal Lab Results: Lab values include: Group B Strep negative    ROS:  +FM, +Ctx, -LoF, -VB Patient denies HA, Visual Disturbances, Epigastric Pain, and SOB Patient denies recent illness and issues with GI upset and urination  Allergies  Allergen Reactions  . Penicillins Nausea And Vomiting  . Latex Swelling and Rash       Blood pressure 137/77, pulse 91, temperature 99 F (37.2 C), resp. rate 18, height 5' 3.5" (1.613 m),  weight 88.451 kg (195 lb), last menstrual period 08/14/2014.  Physical Exam  Constitutional: She is oriented to person, place, and time. She appears well-developed and well-nourished.  HENT:  Head: Normocephalic and atraumatic.  Eyes: Conjunctivae are normal.  Neck: Normal range of  motion.  Cardiovascular: Normal rate, regular rhythm and normal heart sounds.   Respiratory: Effort normal and breath sounds normal.  GI: Soft. Bowel sounds are normal.  Musculoskeletal: Normal range of motion.  Neurological: She is alert and oriented to person, place, and time.  Skin: Skin is warm and dry.  Psychiatric: She has a normal mood and affect. Her behavior is normal.    Le[place]: EFW: 6lbs 3/4-7lbs Presentation: Vertex VE: 1-2/Th/-3  FHR: 135 bpm, Mod Var, -Decels, +Accels UCs:  Q1-62min, palpates mild  Prenatal labs: ABO, Rh: A/Positive/-- (10/03 0000) Antibody: Negative (10/03 0000) Rubella:  Immune RPR: Nonreactive (10/03 0000)  HBsAg: Negative (10/03 0000)  HIV: Non-reactive (10/03 0000)  GBS: Negative (03/27 0000) Sickle cell/Hgb electrophoresis:  Normal Pap:  ASCUS & Neg HPV-12/09/2014 GC:  Negative Chlamydia:  Negative Other:  Varicella Immune    Assessment IUP at 41wks Cat I FT Post Dates Fetal: Left Kidney Dilation GBS Negative Latex Allergy PCN Allergy  Plan: Admit to Birthing Suites per consult with Dr. ND Routine Labor and Delivery Orders per [facility] Protocol Routine Induction/Augmentation Orders In room to complete assessment and discuss POC: -Discussed r/b of induction including fetal distress, serial induction, pain, and increased risk of c/s delivery -Discussed induction methods including cervical ripening agents, foley bulbs, and pitocin -Patient verbalizes understanding and wishes to proceed with induction process Okay for nitrous oxide and/or fentanyl IV as desired Foley catheter placed without issues  Start pitocin at 36mUn/min and hold Dr.ND updated on patient status  Joellyn Quails, MSN 05/28/2015, 2:29 AM

## 2015-05-28 NOTE — Progress Notes (Signed)
During epidural, US tracing maternal heartrate from [phone]. US adjusted at 1503 to trace FHR. FHR at 135 bpm.

## 2015-05-28 NOTE — Anesthesia Procedure Notes (Signed)
Epidural Patient location during procedure: OB  Staffing Anesthesiologist: Marcene DuosFITZGERALD, Devarion Mcclanahan  Preanesthetic Checklist Completed: patient identified, site marked, surgical consent, pre-op evaluation, timeout performed, IV checked, risks and benefits discussed and monitors and equipment checked  Epidural Patient position: sitting Prep: site prepped and draped and DuraPrep Patient monitoring: continuous pulse ox and blood pressure Approach: midline Location: L4-L5 Injection technique: LOR saline  Needle:  Needle type: Tuohy  Needle gauge: 17 G Needle length: 9 cm and 9 Needle insertion depth: 5 cm cm Catheter type: closed end flexible Catheter size: 19 Gauge Catheter at skin depth: 10 cm Test dose: negative  Assessment Events: blood not aspirated, injection not painful, no injection resistance, negative IV test and no paresthesia

## 2015-05-29 ENCOUNTER — Encounter (HOSPITAL_COMMUNITY): Payer: Self-pay

## 2015-05-29 LAB — CBC
HEMATOCRIT: 29.8 % — AB (ref 36.0–46.0)
Hemoglobin: 9.8 g/dL — ABNORMAL LOW (ref 12.0–15.0)
MCH: 27.3 pg (ref 26.0–34.0)
MCHC: 32.9 g/dL (ref 30.0–36.0)
MCV: 83 fL (ref 78.0–100.0)
PLATELETS: 202 10*3/uL (ref 150–400)
RBC: 3.59 MIL/uL — ABNORMAL LOW (ref 3.87–5.11)
RDW: 15 % (ref 11.5–15.5)
WBC: 16.2 10*3/uL — AB (ref 4.0–10.5)

## 2015-05-29 MED ORDER — OXYCODONE-ACETAMINOPHEN 5-325 MG PO TABS
1.0000 | ORAL_TABLET | ORAL | Status: DC | PRN
  Administered 2015-05-29 – 2015-05-31 (×6): 1 via ORAL
  Filled 2015-05-29 (×7): qty 1

## 2015-05-29 MED ORDER — SENNOSIDES-DOCUSATE SODIUM 8.6-50 MG PO TABS
2.0000 | ORAL_TABLET | ORAL | Status: DC
  Administered 2015-05-30 – 2015-05-31 (×2): 2 via ORAL
  Filled 2015-05-29 (×2): qty 2

## 2015-05-29 MED ORDER — DIPHENHYDRAMINE HCL 25 MG PO CAPS: 25.0000 mg | ORAL_CAPSULE | Freq: Four times a day (QID) | ORAL | Status: DC | PRN

## 2015-05-29 MED ORDER — SIMETHICONE 80 MG PO CHEW
80.0000 mg | CHEWABLE_TABLET | ORAL | Status: DC | PRN
Start: 2015-05-29 — End: 2015-05-31

## 2015-05-29 MED ORDER — PRENATAL MULTIVITAMIN CH
1.0000 | ORAL_TABLET | Freq: Every day | ORAL | Status: DC
  Administered 2015-05-29 – 2015-05-31 (×3): 1 via ORAL
  Filled 2015-05-29 (×3): qty 1

## 2015-05-29 MED ORDER — IBUPROFEN 600 MG PO TABS
600.0000 mg | ORAL_TABLET | Freq: Four times a day (QID) | ORAL | Status: DC
  Administered 2015-05-29 – 2015-05-31 (×10): 600 mg via ORAL
  Filled 2015-05-29 (×10): qty 1

## 2015-05-29 MED ORDER — DIBUCAINE 1 % RE OINT
1.0000 "application " | TOPICAL_OINTMENT | RECTAL | Status: DC | PRN
  Administered 2015-05-29: 1 via RECTAL
  Filled 2015-05-29: qty 28

## 2015-05-29 MED ORDER — WITCH HAZEL-GLYCERIN EX PADS
1.0000 "application " | MEDICATED_PAD | CUTANEOUS | Status: DC | PRN
  Administered 2015-05-29: 1 via TOPICAL

## 2015-05-29 MED ORDER — ONDANSETRON HCL 4 MG/2ML IJ SOLN
4.0000 mg | INTRAMUSCULAR | Status: DC | PRN
Start: 2015-05-29 — End: 2015-05-31

## 2015-05-29 MED ORDER — TETANUS-DIPHTH-ACELL PERTUSSIS 5-2.5-18.5 LF-MCG/0.5 IM SUSP: 0.5000 mL | Freq: Once | INTRAMUSCULAR | Status: DC

## 2015-05-29 MED ORDER — ACETAMINOPHEN 325 MG PO TABS: 650.0000 mg | ORAL_TABLET | ORAL | Status: DC | PRN

## 2015-05-29 MED ORDER — LANOLIN HYDROUS EX OINT: TOPICAL_OINTMENT | CUTANEOUS | Status: DC | PRN

## 2015-05-29 MED ORDER — ZOLPIDEM TARTRATE 5 MG PO TABS: 5.0000 mg | ORAL_TABLET | Freq: Every evening | ORAL | Status: DC | PRN

## 2015-05-29 MED ORDER — ONDANSETRON HCL 4 MG PO TABS: 4.0000 mg | ORAL_TABLET | ORAL | Status: DC | PRN

## 2015-05-29 MED ORDER — BENZOCAINE-MENTHOL 20-0.5 % EX AERO
1.0000 "application " | INHALATION_SPRAY | CUTANEOUS | Status: DC | PRN
  Administered 2015-05-29: 1 via TOPICAL
  Filled 2015-05-29: qty 56

## 2015-05-29 NOTE — Progress Notes (Signed)
Returned call to Midwife regarding pain medication; Venus just out of delivery.  Received verbal order for 1 tablet of Percocet 5-235 tablet q 4 hours.  Will give medication and reasses pain; see charting.  Will continue to monitor.   Vivi MartensAshley Reilly Blades RN

## 2015-05-29 NOTE — Anesthesia Postprocedure Evaluation (Signed)
Anesthesia Post Note  Patient: Margaret Logan  Procedure(s) Performed: * No procedures listed *  Patient location during evaluation: Mother Baby Anesthesia Type: Epidural Level of consciousness: awake and alert Pain management: pain level controlled Vital Signs Assessment: post-procedure vital signs reviewed and stable Respiratory status: spontaneous breathing Cardiovascular status: stable Postop Assessment: no headache, no backache, epidural receding and patient able to bend at knees Anesthetic complications: no    Last Vitals:  Filed Vitals:   05/29/15 0245 05/29/15 0400  BP: 119/64 118/69  Pulse: 102 95  Temp: 37 C 36.9 C  Resp: 20 18    Last Pain:  Filed Vitals:   05/29/15 0414  PainSc: 0-No pain                 Edison PaceWILKERSON,Jarvis Sawa

## 2015-05-29 NOTE — Lactation Note (Signed)
This note was copied from a baby's chart. Lactation Consultation Note   P2, Ex BF 3 months Baby latched in football.  Mother unlatched and showed mother how to latch deeper on breast and massage. Reviewed basics and answered questions. Mom encouraged to feed baby 8-12 times/24 hours and with feeding cues.  Mom made aware of O/P services, breastfeeding support groups, community resources, and our phone # for post-discharge questions.     Patient Name: [name] ZOXWR'UToday's Date: 05/29/2015 Reason for consult: Initial assessment   Maternal Data Has patient been taught Hand Expression?: Yes Does the patient have breastfeeding experience prior to this delivery?: Yes  Feeding Feeding Type: Breast Fed Length of feed: 20 min  LATCH Score/Interventions Latch: Grasps breast easily, tongue down, lips flanged, rhythmical sucking.  Audible Swallowing: A few with stimulation  Type of Nipple: Everted at rest and after stimulation  Comfort (Breast/Nipple): Soft / non-tender     Hold (Positioning): No assistance needed to correctly position infant at breast.  LATCH Score: 9  Lactation Tools Discussed/Used     Consult Status Consult Status: Follow-up Date: 05/30/15 Follow-up type: In-patient    Dahlia ByesBerkelhammer, [name] 05/29/2015, 3:05 PM

## 2015-05-29 NOTE — Progress Notes (Signed)
UR chart review completed.  

## 2015-05-29 NOTE — Progress Notes (Addendum)
Called Midwife for an order for more pain medication; patient requesting something stronger than Tylenol or Motrin.  Midwife in with another patient, left note with RN at bedside with midwife to call this RN back when finished.  Will continue to monitor.  Patient stated her pain is ok unless she is nursing or up moving around, then it is about a 7.  Encouraged patient to empty her bladder since it had been a while since she did, education provided on how keeping her baldder emptied can aid in decreasing discomfort in the abdomen and help lighten her bleeding.

## 2015-05-30 MED ORDER — IBUPROFEN 600 MG PO TABS: 600.0000 mg | ORAL_TABLET | Freq: Four times a day (QID) | ORAL | Status: AC | PRN

## 2015-05-30 MED ORDER — OXYCODONE-ACETAMINOPHEN 5-325 MG PO TABS: 1.0000 | ORAL_TABLET | ORAL | Status: DC | PRN

## 2015-05-30 NOTE — Discharge Instructions (Signed)

## 2015-05-30 NOTE — Discharge Summary (Signed)
[facility] [place] [facility]   Patient Name:   Margaret PoliceHailey Logan DOB:     08/13/1990 MRN:     [id number]  Date of Admission:   05/28/2015 Date of Discharge:  05/30/2015  Admitting diagnosis:    INDUCTION Principal Problem:   SVD (spontaneous vaginal delivery) Active Problems:   Post-dates pregnancy   Asthma   Pregnancy complicated by fetal genitourinary abnormality   Shoulder dystocia during labor and delivery, delivered  Term Pregnancy Delivered    Discharge diagnosis:    INDUCTION Principal Problem:   SVD (spontaneous vaginal delivery) Active Problems:   Post-dates pregnancy   Asthma   Pregnancy complicated by fetal genitourinary abnormality   Shoulder dystocia during labor and delivery, delivered  Term Pregnancy Delivered                                                                     Post partum procedures: None  Type of Delivery:  SVB  Delivering Provider: Gerrit HeckEMLY, JESSICA   Date of Delivery:  05/29/15  Newborn Data:    Live born female  Birth Weight: 8 lb 2.9 oz (3710 g) APGAR: 2, 9  Baby's Name:  ? Baby Feeding:   Breast Disposition:   home with mother  Complications:   Shoulder dystocia  Hospital course:      Onset of Labor With Vaginal Delivery     25 y.o. yo [id number] at 515w1d was admitted for induction due to postdates on 05/28/2015. Patient's delivery was complicated by shoulder dystocia, light MSF, maternal temp treated with Unasyn,  and Apgars 2/9.  Baby responded quickly to initial resuscitation measures, and was able to remain in room with mother Membrane Rupture Time/Date: 4:00 PM ,05/28/2015   Intrapartum Procedures: Episiotomy: None [1]                                         Lacerations:  None [1]  Patient had a delivery of a Viable infant. 05/29/2015  Information for the patient's newborn:  Marrion CoyBrooks, Girl Delphine [[id number]][[id number]]  Delivery Method: Vaginal, Spontaneous Delivery (Filed from Delivery Summary)    Pateint had an  uncomplicated postpartum course.  She is ambulating, tolerating a regular diet, passing flatus, and urinating well. Patient is discharged home in stable condition on 05/30/2015.    Physical Exam:   Filed Vitals:   05/29/15 0830 05/29/15 1605 05/29/15 1700 05/30/15 0647  BP: 105/62 121/63 115/68 103/86  Pulse: 78 84 85 63  Temp: 98.1 F (36.7 C) 98.6 F (37 C) 98.3 F (36.8 C) 97.4 F (36.3 C)  TempSrc: Oral Oral Oral   Resp: 18 18 18 18   Height:      Weight:      SpO2:       General: alert Lochia: appropriate Uterine Fundus: firm Incision: perineum intact DVT Evaluation: No evidence of DVT seen on physical exam. Negative Homan's sign.  Labs: CBC Latest Ref Rng 05/29/2015 05/28/2015 12/03/2014  WBC 4.0 - 10.5 K/uL 16.2(H) 10.3 9.7  Hemoglobin 12.0 - 15.0 g/dL 9.5(M9.8(L) 11.4(L) 10.9(L)  Hematocrit 36.0 - 46.0 % 29.8(L) 34.5(L) 30.8(L)  Platelets 150 - 400 K/uL 202 244 258  CMP Latest Ref Rng 12/03/2014  Glucose 65 - 99 mg/dL 782(N)  BUN 6 - 20 mg/dL 8  Creatinine 5.62 - 1.30 mg/dL 8.65  Sodium 784 - 696 mmol/L 135  Potassium 3.5 - 5.1 mmol/L 3.7  Chloride 101 - 111 mmol/L 105  CO2 22 - 32 mmol/L 24  Calcium 8.9 - 10.3 mg/dL 9.3  Total Protein 6.5 - 8.1 g/dL 7.6  Total Bilirubin 0.3 - 1.2 mg/dL 0.5  Alkaline Phos 38 - 126 U/L 45  AST 15 - 41 U/L 24  ALT 14 - 54 U/L 16    Discharge instruction: per After Visit Summary and "Baby and Me Booklet".  After Visit Meds:    Medication List    TAKE these medications        diphenhydramine-acetaminophen 25-500 MG Tabs tablet  Commonly known as:  TYLENOL PM  Take 1 tablet by mouth at bedtime as needed (sleep).     ibuprofen 600 MG tablet  Commonly known as:  ADVIL,MOTRIN  Take 1 tablet (600 mg total) by mouth every 6 (six) hours as needed.     oxyCODONE-acetaminophen 5-325 MG tablet  Commonly known as:  PERCOCET/ROXICET  Take 1 tablet by mouth every 4 (four) hours as needed for moderate pain.     prenatal  multivitamin Tabs tablet  Take 1 tablet by mouth daily at 12 noon.        Diet: routine diet  Activity: Advance as tolerated. Pelvic rest for 6 weeks.   Outpatient follow up:6 weeks Follow up Appt:No future appointments. Follow up visit: No Follow-up on file.  Postpartum contraception: Declines  05/30/2015 Nigel Bridgeman, CNM

## 2015-05-30 NOTE — Progress Notes (Signed)
Subjective: Postpartum Day 1: Vaginal delivery, no laceration--shoulder dystocia, with infant quickly responsive to resuscitation, no residual trauma noted. Patient up ad lib, reports no syncope or dizziness. Feeding: Breast Contraceptive plan:  Declines  Patient had desired early d/c, but peds recommends d/c tomorrow, due to fever during labor and administration of Unasyn.  Objective: Vital signs in last 24 hours: Temp:  [97.4 F (36.3 C)-98.6 F (37 C)] 97.4 F (36.3 C) (03/31 0647) Pulse Rate:  [63-85] 63 (03/31 0647) Resp:  [18] 18 (03/31 0647) BP: (103-121)/(63-86) 103/86 mmHg (03/31 0647)   Filed Vitals:   05/29/15 0830 05/29/15 1605 05/29/15 1700 05/30/15 0647  BP: 105/62 121/63 115/68 103/86  Pulse: 78 84 85 63  Temp: 98.1 F (36.7 C) 98.6 F (37 C) 98.3 F (36.8 C) 97.4 F (36.3 C)  TempSrc: Oral Oral Oral   Resp: 18 18 18 18   Height:      Weight:      SpO2:       Orthostatics stable.  Physical Exam:  General: alert Lochia: appropriate Uterine Fundus: firm Perineum: Intact DVT Evaluation: No evidence of DVT seen on physical exam. Negative Homan's sign.   CBC Latest Ref Rng 05/29/2015 05/28/2015 12/03/2014  WBC 4.0 - 10.5 K/uL 16.2(H) 10.3 9.7  Hemoglobin 12.0 - 15.0 g/dL 1.6(X9.8(L) 11.4(L) 10.9(L)  Hematocrit 36.0 - 46.0 % 29.8(L) 34.5(L) 30.8(L)  Platelets 150 - 400 K/uL 202 244 258     Assessment/Plan: Status post vaginal delivery day 1. Anemia due to acute blood loss--stable hemodynamically Stable Continue current care. Plan for discharge tomorrow    Nyra CapesLATHAM, VICKICNM 05/30/2015, 9:02 AM

## 2015-05-30 NOTE — Lactation Note (Signed)
This note was copied from a baby's chart. Lactation Consultation Note  Baby sleeping with pacificer in mouth.  Pacifier use not recommended at this time.  Mother denies problems. Mom encouraged to feed baby 8-12 times/24 hours and with feeding cues.  Reviewed engorgement care and monitoring voids/stools. Provided manual pump.   Patient Name: [name] XBJYN'WToday's Date: 05/30/2015 Reason for consult: Follow-up assessment   Maternal Data    Feeding Feeding Type: Breast Fed Length of feed: 20 min  LATCH Score/Interventions Latch: Grasps breast easily, tongue down, lips flanged, rhythmical sucking.  Audible Swallowing: Spontaneous and intermittent  Type of Nipple: Everted at rest and after stimulation  Comfort (Breast/Nipple): Filling, red/small blisters or bruises, mild/mod discomfort  Problem noted: Mild/Moderate discomfort Interventions (Mild/moderate discomfort): Hand expression  Hold (Positioning): No assistance needed to correctly position infant at breast.  LATCH Score: 9  Lactation Tools Discussed/Used     Consult Status Consult Status: Complete    Hardie PulleyBerkelhammer, Akaya Proffit Boschen 05/30/2015, 8:28 AM

## 2015-05-31 ENCOUNTER — Inpatient Hospital Stay (HOSPITAL_COMMUNITY): Admission: RE | Admit: 2015-05-31 | Payer: Medicaid Other | Source: Ambulatory Visit

## 2015-05-31 NOTE — Lactation Note (Addendum)
This note was copied from a baby's chart. Lactation Consultation Note  Patient Name: Margaret Keane PoliceHailey Lewing WUJWJ'XToday's Date: 05/31/2015 Reason for consult: Follow-up assessment Baby is 5658 hours old, 7% weight loss and has been to the breast consistently.  Per mom breast are feeling warmer and heavier. Baby [facility], still seems hungry. LC assisted mom to latch in cross cradle  And worked on depth and breast compressions. Per mom more comfortable.  Swallows noted. Sore nipple and engorgement prevention and tx reviewed.  LC instructed mom on the use of shells, comfort gels, and mom already has hand pump.  Also increased flange size to #27 for when the milk comes in .  Mother informed of post-discharge support and given phone number to the lactation department, including services for phone call assistance; out-patient appointments; and breastfeeding support group. List of other breastfeeding resources in the community given in the handout. Encouraged mother to call for problems or concerns related to breastfeeding. LC stressed to mom and dad to avoid pacifier or limit use due to interfering with latch that they have worked so hard on. .   Maternal Data Has patient been taught Hand Expression?: Yes  Feeding Feeding Type: Breast Fed Length of feed: 7 min (several swallows )  LATCH Score/Interventions Latch: Grasps breast easily, tongue down, lips flanged, rhythmical sucking. Intervention(s): Adjust position;Assist with latch;Breast massage;Breast compression  Audible Swallowing: Spontaneous and intermittent  Type of Nipple: Everted at rest and after stimulation  Comfort (Breast/Nipple): Filling, red/small blisters or bruises, mild/mod discomfort  Problem noted: Filling  Hold (Positioning): Assistance needed to correctly position infant at breast and maintain latch. Intervention(s): Breastfeeding basics reviewed;Support Pillows;Position options;Skin  to skin  LATCH Score: 8  Lactation Tools Discussed/Used Tools: Shells;Comfort gels Shell Type: Inverted Pump Review: Milk Storage   Consult Status Consult Status: Complete Date: 05/31/15 Follow-up type: In-patient    Margaret Logan, Margaret Logan 05/31/2015, 10:46 AM

## 2015-05-31 NOTE — Discharge Summary (Signed)
[facility] Discharge Summary   Patient Name: Margaret Logan DOB: 03/15/1990 MRN: [id number]  Date of Admission: 05/28/2015 Date of Discharge:05/31/2015  Admitting diagnosis:  INDUCTION Principal Problem:  SVD (spontaneous vaginal delivery) Active Problems:  Post-dates pregnancy  Asthma  Pregnancy complicated by fetal genitourinary abnormality  Shoulder dystocia during labor and delivery, delivered  Term Pregnancy Delivered  Discharge diagnosis:   INDUCTION Principal Problem:  SVD (spontaneous vaginal delivery) Active Problems:  Post-dates pregnancy  Asthma  Pregnancy complicated by fetal genitourinary abnormality  Shoulder dystocia during labor and delivery, delivered  Term Pregnancy Delivered    Post partum procedures:None  Type of Delivery:SVB  Delivering Provider:EMLY, JESSICA   Date of Delivery:05/29/15  Newborn Data:  Live born female  Birth Weight: 8 lb 2.9 oz (3710 g) APGAR: 2, 9  Baby's Name:Margaret Logan Baby Feeding: Breast Disposition:home with mother  Complications: Shoulder dystocia  Hospital course:   Onset of Labor With Vaginal Delivery 25 y.o. yo [id number] at [redacted]w[redacted]d was admitted for induction due to postdates on 05/28/2015. Patient's delivery was complicated by shoulder dystocia, light MSF, maternal temp treated with Unasyn, and Apgars 2/9. Baby responded quickly to initial resuscitation measures, and was able to remain in room with mother Membrane Rupture Time/Date: 4:00 PM ,05/28/2015    Intrapartum Procedures: Episiotomy: None [1]   Lacerations: None [1]  Patient had a delivery of a Viable infant. 05/29/2015  Information for the patient's newborn:  Margaret Logan, [place] [[id number]]  Delivery Method: Vaginal, Spontaneous Delivery (Filed from Delivery Summary)    Pateint had an uncomplicated postpartum course. She is ambulating, tolerating a regular diet, passing flatus, and urinating well. Patient is discharged home in stable condition on 05/30/2015.    Physical Exam:  Filed Vitals:   05/29/15 0830 05/29/15 1605 05/29/15 1700 05/30/15 0647  BP: 105/62 121/63 115/68 103/86  Pulse: 78 84 85 63  Temp: 98.1 F (36.7 C) 98.6 F (37 C) 98.3 F (36.8 C) 97.4 F (36.3 C)  TempSrc: Oral Oral Oral   Resp: Height:      Weight:      SpO2:       General: alert Lochia: appropriate Uterine Fundus: firm Incision: perineum intact DVT Evaluation: No evidence of DVT seen on physical exam. Negative Homan's sign.  Labs: CBC Latest Ref Rng 05/29/2015 05/28/2015 12/03/2014  WBC 4.0 - 10.5 K/uL 16.2(H) 10.3 9.7  Hemoglobin 12.0 - 15.0 g/dL 1.0(U) 11.4(L) 10.9(L)  Hematocrit 36.0 - 46.0 % 29.8(L) 34.5(L) 30.8(L)  Platelets 150 - 400 K/uL 202 244 258    CMP Latest Ref Rng 12/03/2014  Glucose 65 - 99 mg/dL 725(D)  BUN 6 - 20 mg/dL 8  Creatinine 6.64 - 4.03 mg/dL 4.74  Sodium 259 - 563 mmol/L 135  Potassium 3.5 - 5.1 mmol/L 3.7  Chloride 101 - 111 mmol/L 105  CO2 22 - 32 mmol/L 24  Calcium 8.9 - 10.3 mg/dL 9.3  Total Protein 6.5 - 8.1 g/dL 7.6  Total Bilirubin 0.3 - 1.2 mg/dL 0.5  Alkaline Phos 38 - 126 U/L 45  AST 15 - 41 U/L 24  ALT 14 - 54 U/L 16    Discharge instruction: per After Visit Summary and "Baby and Me Booklet".  After Visit Meds:    Medication List    TAKE these medications        diphenhydramine-acetaminophen 25-500 MG Tabs tablet  Commonly known as: TYLENOL PM  Take 1 tablet by mouth at bedtime as needed (sleep).  ibuprofen 600 MG tablet  Commonly known as: ADVIL,MOTRIN  Take 1 tablet (600 mg total) by mouth every 6 (six) hours as needed.     oxyCODONE-acetaminophen 5-325 MG tablet  Commonly known as: PERCOCET/ROXICET  Take 1 tablet by mouth every 4 (four) hours as needed for moderate pain.     prenatal multivitamin Tabs tablet  Take 1 tablet by mouth daily at 12 noon.        Diet: routine diet  Activity: Advance as tolerated. Pelvic rest for 6 weeks.   Outpatient follow up:6 weeks Follow up Appt:No future appointments. Follow up visit: No Follow-up on file.  Postpartum contraception: Declines

## 2015-12-18 LAB — OB RESULTS CONSOLE RUBELLA ANTIBODY, IGM: Rubella: IMMUNE

## 2015-12-18 LAB — OB RESULTS CONSOLE ABO/RH: RH Type: POSITIVE

## 2015-12-18 LAB — OB RESULTS CONSOLE GC/CHLAMYDIA
CHLAMYDIA, DNA PROBE: NEGATIVE
GC PROBE AMP, GENITAL: NEGATIVE

## 2015-12-18 LAB — OB RESULTS CONSOLE HEPATITIS B SURFACE ANTIGEN: Hepatitis B Surface Ag: NEGATIVE

## 2015-12-18 LAB — OB RESULTS CONSOLE HIV ANTIBODY (ROUTINE TESTING): HIV: NONREACTIVE

## 2015-12-18 LAB — OB RESULTS CONSOLE ANTIBODY SCREEN: ANTIBODY SCREEN: NEGATIVE

## 2015-12-18 LAB — OB RESULTS CONSOLE RPR: RPR: NONREACTIVE

## 2015-12-22 ENCOUNTER — Encounter: Payer: Self-pay | Admitting: Obstetrics and Gynecology

## 2016-01-13 ENCOUNTER — Encounter (HOSPITAL_COMMUNITY): Payer: Self-pay | Admitting: *Deleted

## 2016-01-14 ENCOUNTER — Ambulatory Visit (HOSPITAL_COMMUNITY)
Admission: RE | Admit: 2016-01-14 | Discharge: 2016-01-14 | Disposition: A | Discharge status: Home / Self Care | Payer: Medicaid Other | Source: Ambulatory Visit | Attending: Obstetrics and Gynecology | Admitting: Obstetrics and Gynecology

## 2016-01-14 ENCOUNTER — Other Ambulatory Visit (HOSPITAL_COMMUNITY): Payer: Self-pay | Admitting: *Deleted

## 2016-01-14 ENCOUNTER — Encounter (HOSPITAL_COMMUNITY): Payer: Self-pay

## 2016-01-14 ENCOUNTER — Other Ambulatory Visit (HOSPITAL_COMMUNITY): Payer: Self-pay | Admitting: Obstetrics and Gynecology

## 2016-01-14 DIAGNOSIS — Z3689 Encounter for other specified antenatal screening: Secondary | ICD-10-CM

## 2016-01-14 DIAGNOSIS — Z315 Encounter for genetic counseling: Secondary | ICD-10-CM | POA: Diagnosis present

## 2016-01-14 DIAGNOSIS — O281 Abnormal biochemical finding on antenatal screening of mother: Secondary | ICD-10-CM

## 2016-01-14 DIAGNOSIS — O283 Abnormal ultrasonic finding on antenatal screening of mother: Secondary | ICD-10-CM | POA: Insufficient documentation

## 2016-01-14 DIAGNOSIS — O289 Unspecified abnormal findings on antenatal screening of mother: Secondary | ICD-10-CM | POA: Insufficient documentation

## 2016-01-14 DIAGNOSIS — IMO0002 Reserved for concepts with insufficient information to code with codable children: Secondary | ICD-10-CM

## 2016-01-14 DIAGNOSIS — Z0489 Encounter for examination and observation for other specified reasons: Secondary | ICD-10-CM

## 2016-01-14 DIAGNOSIS — Z3A15 15 weeks gestation of pregnancy: Secondary | ICD-10-CM

## 2016-01-14 LAB — QUAD SCREEN FOR MFM

## 2016-01-14 NOTE — Progress Notes (Signed)
Genetic Counseling  Visit Summary Note  Appointment Date: 01/14/2016 Referred By: Philip Aspenallahan,Sidney  Date of Birth: 03/08/1990  Pregnancy history: [id number] Estimated Date of Delivery: 07/01/16 Estimated Gestational Age: 6066w6d  Margaret Logan and her husband, Margaret Logan, were seen for genetic counseling to discuss the results of her cell free DNA screening as well as other available testing options.   In summary:  Discussed low fetal fraction results from NIPS  Discussed additional options for screening  Quad screen - drawn today  NIPS  Ultrasound  Discussed diagnostic testing options  Amniocentesis - declined  Reviewed family history concerns - none reported  Discussed carrier screening options  CF - declined  SMA - declined  Hemoglobinopathies - normal electrophoresis  Margaret Logan had noninvasive prenatal screening (NIPS)/cell free DNA screening attempted through her OB office at 12 weeks and [redacted] weeks gestation. Both were reported to have a low fetal fraction (4.5% and 2.9%), and thus, no result was obtained from this screening. We reviewed the methodology of NIPS and discussed differential diagnoses for low fetal fraction including obesity, physiological differences in maternal blood, and underlying fetal aneuploidy. Additionally, we discussed that there may be additional factors that impact fetal fraction in pregnancy that are not yet reported in the medical literature. A redraw after one non-reportable NIPS result has an approximate 80-85% chance of obtaining a result. We discussed that a third attempt at NIPS is an option, given that the first two samples were drawn so close together, but also discussed additional screening and testing options for fetal aneuploidy.   This couple was counseled regarding the availability of alternative methods of screening for fetal aneuploidy. Considering Margaret Logan' maternal age of 89 and a non-contributory family history, we  discussed that she is considered to be in the low risk category for having a fetus with aneuploidy. We reviewed chromosomes, nondisjunction, and the associated 1 in 1500 risk for fetal Down syndrome, less than 1 in 4000 risk for fetal Trisomy 18, and less than 1 in 9000 risk for fetal Trisomy 7513, related to a maternal age of 25 at 2966w6d gestation. They were counseled that the risk for aneuploidy decreases as gestational age increases, accounting for those pregnancies which spontaneously abort. We briefly discussed common fetal chromosome conditions including the features and prognoses of each.   We reviewed the option of Quad screen and detailed ultrasound. They were counseled that screening tests are used to modify a patient's a priori risk for aneuploidy, typically based on age. This estimate provides a pregnancy specific risk assessment. We reviewed the benefits and limitations of each option. Specifically, we discussed the conditions for which each test screens, the detection rates, and false positive rates of each. They were also briefly counseled regarding noninvasive prenatal screening (NIPS)/cell free DNA (cfDNA) screening and diagnostic testing via amniocentesis. We discussed that while NIPS and diagnostic testing should be made available to all pregnant patients, ACOG recommends that this testing be offered to those patients who are considered to have a "high" risk for fetal aneuploidy (i.e. those who are AMA, have an abnormal maternal serum or combined screening result, have an abnormal finding by fetal ultrasound, or have a family history of a specific chromosome condition).  Margaret Logan stated that she had Quad screening performed in her prior pregnancies and was comfortable with that option.  Blood was drawn for Quad screen today.  She stated that if the Quad screen returned with a high risk result, she would consider the option  of a repeat NIPS at that later gestation.  We discussed that her  ultrasound today was limited by early gestation, but no fetal anomalies or soft markers of aneuploidy were seen.  Both family histories were reviewed and found to be noncontributory for birth defects, intellectual disability, and known genetic conditions. Without further information regarding the provided family history, an accurate genetic risk cannot be calculated. Further genetic counseling is warranted if more information is obtained.  Margaret Logan reports has no health concerns and denied exposure to environmental toxins or chemical agents. She denied the use of alcohol, tobacco or street drugs. She denied significant viral illnesses during the course of her pregnancy.   They were counseled regarding ACOG recommended screening for cystic fibrosis, spinal muscular atrophy and hemoglobinopathies.  We reviewed the autosomal recessive patterns of inheritance of these conditions, the carrier frequencies, and the availability of prenatal diagnosis if indicated. In addition, they were made aware that hemoglobinopathies and cystic fibrosis are routinely screened for as part of the [facility] newborn screening panel. We discussed the cost of the testing, and the next steps if she were to be identified to be a carrier.  She has previously had a normal hemoglobin electrophoresis and declined additional carrier screening today.  I counseled this couple regarding the above risks and available options. The approximate face-to-face time with the genetic counselor was 45 minutes.  Mady Gemmaaragh Tyona Nilsen, MS,  Certified Genetic Counselor

## 2016-01-15 ENCOUNTER — Encounter (HOSPITAL_COMMUNITY): Payer: Self-pay

## 2016-01-15 ENCOUNTER — Other Ambulatory Visit (HOSPITAL_COMMUNITY): Payer: Self-pay

## 2016-01-19 ENCOUNTER — Other Ambulatory Visit: Payer: Self-pay | Admitting: Obstetrics & Gynecology

## 2016-02-11 ENCOUNTER — Other Ambulatory Visit (HOSPITAL_COMMUNITY): Payer: Self-pay

## 2016-02-20 ENCOUNTER — Ambulatory Visit (HOSPITAL_COMMUNITY): Payer: Medicaid Other

## 2016-02-20 ENCOUNTER — Encounter (HOSPITAL_COMMUNITY): Payer: Self-pay

## 2016-03-01 NOTE — L&D Delivery Note (Signed)
Vaginal Delivery Note - Shoulder dystocia Patient pushed for less than 10 minutes after she was noted to be C/C/+2. At 7:54 PM a viable and healthy female was delivered via Vaginal, Spontaneous Delivery.  Presentation: vertex; Position: Left,, Occiput,, Anterior; After the delivery of the head, the anterior shoulder did not immediately deliver with gentle downward traction and a shoulder dystocia was performed. Simultaneous McRoberts and Suprapubic pressure was applied by the assisting nursing staff and the anterior shoulder delivered followed by the rest of the body easily.  The baby was placed on the maternal abdomen.  The baby had a vigorous cry and was moving all four extremities well. The cord was double clamped after one minute of delayed cord clamping and cut by the father.  The placenta spontaneously delivered intact 3 vessels noted.  Uterine atony was alleviated by massage and pitocin.  There were no noted lacerations.   Delivery of the head:   , McRoberts Second maneuver: , Suprapubic Pressure   Verbal consent: obtained from patient.  APGAR: 9, 9; weight  pending Placenta status:intact 3 vessels Anesthesia:  Epidural Episiotomy: None Lacerations: None Suture Repair: n/a Est. Blood Loss (mL): 300  Mom to postpartum.  Baby to Couplet care / Skin to Skin.  Essie Hart [name] 06/28/2016, 8:07 PM

## 2016-05-27 LAB — OB RESULTS CONSOLE GBS: STREP GROUP B AG: NEGATIVE

## 2016-06-24 ENCOUNTER — Encounter (HOSPITAL_COMMUNITY): Payer: Self-pay | Admitting: *Deleted

## 2016-06-24 ENCOUNTER — Telehealth (HOSPITAL_COMMUNITY): Payer: Self-pay | Admitting: *Deleted

## 2016-06-24 ENCOUNTER — Other Ambulatory Visit: Payer: Self-pay | Admitting: Obstetrics & Gynecology

## 2016-06-24 NOTE — Telephone Encounter (Signed)
Preadmission screen Pt states she had a 2.5-3 minute shoulder dystocia with last delivery.  That is documented in delivery record per EPIC. This was not noted in the prenatal of this pregnancy.  Office notified of this information.

## 2016-06-28 ENCOUNTER — Encounter (HOSPITAL_COMMUNITY): Payer: Self-pay

## 2016-06-28 ENCOUNTER — Inpatient Hospital Stay (HOSPITAL_COMMUNITY)
Admission: RE | Admit: 2016-06-28 | Discharge: 2016-06-30 | DRG: 775 | Disposition: A | Discharge status: Home / Self Care | Payer: Medicaid Other | Source: Ambulatory Visit | Attending: Obstetrics & Gynecology | Admitting: Obstetrics & Gynecology

## 2016-06-28 ENCOUNTER — Inpatient Hospital Stay (HOSPITAL_COMMUNITY): Payer: Medicaid Other | Admitting: Anesthesiology

## 2016-06-28 DIAGNOSIS — Z3493 Encounter for supervision of normal pregnancy, unspecified, third trimester: Secondary | ICD-10-CM | POA: Diagnosis present

## 2016-06-28 DIAGNOSIS — Z8249 Family history of ischemic heart disease and other diseases of the circulatory system: Secondary | ICD-10-CM | POA: Diagnosis not present

## 2016-06-28 DIAGNOSIS — Z3A39 39 weeks gestation of pregnancy: Secondary | ICD-10-CM | POA: Diagnosis not present

## 2016-06-28 DIAGNOSIS — Z8759 Personal history of other complications of pregnancy, childbirth and the puerperium: Secondary | ICD-10-CM

## 2016-06-28 DIAGNOSIS — J45909 Unspecified asthma, uncomplicated: Secondary | ICD-10-CM | POA: Diagnosis present

## 2016-06-28 DIAGNOSIS — O9952 Diseases of the respiratory system complicating childbirth: Secondary | ICD-10-CM | POA: Diagnosis present

## 2016-06-28 LAB — CBC
HCT: 35.2 % — ABNORMAL LOW (ref 36.0–46.0)
Hemoglobin: 11.7 g/dL — ABNORMAL LOW (ref 12.0–15.0)
MCH: 28.5 pg (ref 26.0–34.0)
MCHC: 33.2 g/dL (ref 30.0–36.0)
MCV: 85.9 fL (ref 78.0–100.0)
PLATELETS: 215 10*3/uL (ref 150–400)
RBC: 4.1 MIL/uL (ref 3.87–5.11)
RDW: 14.6 % (ref 11.5–15.5)
WBC: 9.8 10*3/uL (ref 4.0–10.5)

## 2016-06-28 LAB — TYPE AND SCREEN
ABO/RH(D): A POS
Antibody Screen: NEGATIVE

## 2016-06-28 LAB — RPR: RPR Ser Ql: NONREACTIVE

## 2016-06-28 MED ORDER — OXYTOCIN 40 UNITS IN LACTATED RINGERS INFUSION - SIMPLE MED: 2.5000 [IU]/h | INTRAVENOUS | Status: DC | PRN

## 2016-06-28 MED ORDER — SOD CITRATE-CITRIC ACID 500-334 MG/5ML PO SOLN: 30.0000 mL | ORAL | Status: DC | PRN

## 2016-06-28 MED ORDER — PHENYLEPHRINE 40 MCG/ML (10ML) SYRINGE FOR IV PUSH (FOR BLOOD PRESSURE SUPPORT)
80.0000 ug | PREFILLED_SYRINGE | INTRAVENOUS | Status: DC | PRN
Start: 2016-06-28 — End: 2016-06-29
  Filled 2016-06-28: qty 10
  Filled 2016-06-28: qty 5

## 2016-06-28 MED ORDER — IBUPROFEN 600 MG PO TABS
600.0000 mg | ORAL_TABLET | Freq: Four times a day (QID) | ORAL | Status: DC
  Administered 2016-06-28 – 2016-06-30 (×7): 600 mg via ORAL
  Filled 2016-06-28 (×7): qty 1

## 2016-06-28 MED ORDER — PHENYLEPHRINE 40 MCG/ML (10ML) SYRINGE FOR IV PUSH (FOR BLOOD PRESSURE SUPPORT): 80.0000 ug | PREFILLED_SYRINGE | INTRAVENOUS | Status: DC | PRN

## 2016-06-28 MED ORDER — ACETAMINOPHEN 325 MG PO TABS
650.0000 mg | ORAL_TABLET | ORAL | Status: DC | PRN
  Administered 2016-06-29 (×2): 650 mg via ORAL
  Filled 2016-06-28: qty 2

## 2016-06-28 MED ORDER — DIPHENHYDRAMINE HCL 25 MG PO CAPS: 25.0000 mg | ORAL_CAPSULE | Freq: Four times a day (QID) | ORAL | Status: DC | PRN

## 2016-06-28 MED ORDER — OXYTOCIN 40 UNITS IN LACTATED RINGERS INFUSION - SIMPLE MED
1.0000 m[IU]/min | INTRAVENOUS | Status: DC
  Administered 2016-06-28: 2 m[IU]/min via INTRAVENOUS
  Filled 2016-06-28: qty 1000

## 2016-06-28 MED ORDER — TETANUS-DIPHTH-ACELL PERTUSSIS 5-2.5-18.5 LF-MCG/0.5 IM SUSP: 0.5000 mL | Freq: Once | INTRAMUSCULAR | Status: DC

## 2016-06-28 MED ORDER — WITCH HAZEL-GLYCERIN EX PADS: 1.0000 "application " | MEDICATED_PAD | CUTANEOUS | Status: DC | PRN

## 2016-06-28 MED ORDER — LACTATED RINGERS IV SOLN: 500.0000 mL | Freq: Once | INTRAVENOUS | Status: DC

## 2016-06-28 MED ORDER — CLINDAMYCIN PHOSPHATE 900 MG/50ML IV SOLN: 900.0000 mg | Freq: Once | INTRAVENOUS | Status: DC

## 2016-06-28 MED ORDER — DIPHENHYDRAMINE HCL 50 MG/ML IJ SOLN: 12.5000 mg | INTRAMUSCULAR | Status: DC | PRN

## 2016-06-28 MED ORDER — GENTAMICIN SULFATE 40 MG/ML IJ SOLN
Freq: Once | INTRAVENOUS | Status: AC
  Administered 2016-06-28: 20:00:00 via INTRAVENOUS
  Filled 2016-06-28: qty 3.25

## 2016-06-28 MED ORDER — EPHEDRINE 5 MG/ML INJ
10.0000 mg | INTRAVENOUS | Status: DC | PRN
Start: 2016-06-28 — End: 2016-06-29
  Filled 2016-06-28: qty 2

## 2016-06-28 MED ORDER — ONDANSETRON HCL 4 MG PO TABS: 4.0000 mg | ORAL_TABLET | ORAL | Status: DC | PRN

## 2016-06-28 MED ORDER — LACTATED RINGERS IV SOLN
INTRAVENOUS | Status: DC
  Administered 2016-06-28 (×3): via INTRAVENOUS

## 2016-06-28 MED ORDER — ACETAMINOPHEN 325 MG PO TABS
650.0000 mg | ORAL_TABLET | ORAL | Status: DC | PRN
  Filled 2016-06-28: qty 2

## 2016-06-28 MED ORDER — SENNOSIDES-DOCUSATE SODIUM 8.6-50 MG PO TABS
2.0000 | ORAL_TABLET | ORAL | Status: DC
  Administered 2016-06-28 – 2016-06-29 (×2): 2 via ORAL
  Filled 2016-06-28 (×2): qty 2

## 2016-06-28 MED ORDER — LIDOCAINE HCL (PF) 1 % IJ SOLN
INTRAMUSCULAR | Status: DC | PRN
  Administered 2016-06-28: 4 mL via EPIDURAL

## 2016-06-28 MED ORDER — OXYTOCIN BOLUS FROM INFUSION: 500.0000 mL | Freq: Once | INTRAVENOUS | Status: DC

## 2016-06-28 MED ORDER — MISOPROSTOL 25 MCG QUARTER TABLET
25.0000 ug | ORAL_TABLET | ORAL | Status: DC | PRN
  Administered 2016-06-28 (×2): 25 ug via VAGINAL
  Filled 2016-06-28 (×3): qty 1

## 2016-06-28 MED ORDER — LACTATED RINGERS IV SOLN
500.0000 mL | Freq: Once | INTRAVENOUS | Status: AC
  Administered 2016-06-28: 1000 mL via INTRAVENOUS

## 2016-06-28 MED ORDER — EPHEDRINE 5 MG/ML INJ: 10.0000 mg | INTRAVENOUS | Status: DC | PRN

## 2016-06-28 MED ORDER — ONDANSETRON HCL 4 MG/2ML IJ SOLN: 4.0000 mg | Freq: Four times a day (QID) | INTRAMUSCULAR | Status: DC | PRN

## 2016-06-28 MED ORDER — ACETAMINOPHEN 500 MG PO TABS
1000.0000 mg | ORAL_TABLET | Freq: Four times a day (QID) | ORAL | Status: DC | PRN
  Administered 2016-06-28: 1000 mg via ORAL
  Filled 2016-06-28: qty 2

## 2016-06-28 MED ORDER — OXYTOCIN 40 UNITS IN LACTATED RINGERS INFUSION - SIMPLE MED: 2.5000 [IU]/h | INTRAVENOUS | Status: DC

## 2016-06-28 MED ORDER — FENTANYL 2.5 MCG/ML BUPIVACAINE 1/10 % EPIDURAL INFUSION (WH - ANES)
14.0000 mL/h | INTRAMUSCULAR | Status: DC | PRN
  Administered 2016-06-28 (×2): 14 mL/h via EPIDURAL
  Filled 2016-06-28 (×2): qty 100

## 2016-06-28 MED ORDER — ONDANSETRON HCL 4 MG/2ML IJ SOLN: 4.0000 mg | INTRAMUSCULAR | Status: DC | PRN

## 2016-06-28 MED ORDER — LACTATED RINGERS IV SOLN
500.0000 mL | INTRAVENOUS | Status: DC | PRN
  Administered 2016-06-28: 1000 mL via INTRAVENOUS

## 2016-06-28 MED ORDER — BENZOCAINE-MENTHOL 20-0.5 % EX AERO
1.0000 "application " | INHALATION_SPRAY | CUTANEOUS | Status: DC | PRN
  Administered 2016-06-28: 1 via TOPICAL
  Filled 2016-06-28: qty 56

## 2016-06-28 MED ORDER — PRENATAL MULTIVITAMIN CH
1.0000 | ORAL_TABLET | Freq: Every day | ORAL | Status: DC
  Administered 2016-06-29 – 2016-06-30 (×2): 1 via ORAL
  Filled 2016-06-28 (×2): qty 1

## 2016-06-28 MED ORDER — ZOLPIDEM TARTRATE 5 MG PO TABS: 5.0000 mg | ORAL_TABLET | Freq: Every evening | ORAL | Status: DC | PRN

## 2016-06-28 MED ORDER — DIBUCAINE 1 % RE OINT: 1.0000 "application " | TOPICAL_OINTMENT | RECTAL | Status: DC | PRN

## 2016-06-28 MED ORDER — PHENYLEPHRINE 40 MCG/ML (10ML) SYRINGE FOR IV PUSH (FOR BLOOD PRESSURE SUPPORT)
80.0000 ug | PREFILLED_SYRINGE | INTRAVENOUS | Status: DC | PRN
Start: 2016-06-28 — End: 2016-06-29
  Filled 2016-06-28: qty 5

## 2016-06-28 MED ORDER — LIDOCAINE HCL (PF) 1 % IJ SOLN
30.0000 mL | INTRAMUSCULAR | Status: DC | PRN
  Filled 2016-06-28: qty 30

## 2016-06-28 MED ORDER — COCONUT OIL OIL
1.0000 "application " | TOPICAL_OIL | Status: DC | PRN
  Administered 2016-06-29: 1 via TOPICAL
  Filled 2016-06-28: qty 120

## 2016-06-28 MED ORDER — SIMETHICONE 80 MG PO CHEW: 80.0000 mg | CHEWABLE_TABLET | ORAL | Status: DC | PRN

## 2016-06-28 MED ORDER — TERBUTALINE SULFATE 1 MG/ML IJ SOLN
0.2500 mg | Freq: Once | INTRAMUSCULAR | Status: DC | PRN
Start: 2016-06-28 — End: 2016-06-29
  Filled 2016-06-28: qty 1

## 2016-06-28 NOTE — Anesthesia Pain Management Evaluation Note (Signed)
  CRNA Pain Management Visit Note  Patient: Margaret Logan, 26 y.o., female  "Hello I am a member of the anesthesia team at [facility]. We have an anesthesia team available at all times to provide care throughout the hospital, including epidural management and anesthesia for C-section. I don't know your plan for the delivery whether it a natural birth, water birth, IV sedation, nitrous supplementation, doula or epidural, but we want to meet your pain goals."   1.Was your pain managed to your expectations on prior hospitalizations?   Yes   2.What is your expectation for pain management during this hospitalization?     Epidural and IV pain meds  3.How can we help you reach that goal? Be available  Record the patient's initial score and the patient's pain goal.   Pain: 5  Pain Goal: 5 [facility] wants you to be able to say your pain was always managed very well.  [facility] 06/28/2016

## 2016-06-28 NOTE — Anesthesia Preprocedure Evaluation (Signed)
Anesthesia Evaluation  Patient identified by MRN, date of birth, ID band Patient awake    Reviewed: Allergy & Precautions, Patient's Chart, lab work & pertinent test results  Airway Mallampati: II       Dental no notable dental hx.    Pulmonary asthma ,    Pulmonary exam normal        Cardiovascular negative cardio ROS   Rhythm:Regular Rate:Normal     Neuro/Psych  Headaches,    GI/Hepatic negative GI ROS, Neg liver ROS,   Endo/Other  negative endocrine ROS  Renal/GU negative Renal ROS  negative genitourinary   Musculoskeletal negative musculoskeletal ROS (+)   Abdominal   Peds negative pediatric ROS (+)  Hematology negative hematology ROS (+)   Anesthesia Other Findings   Reproductive/Obstetrics (+) Pregnancy                             Lab Results  Component Value Date   WBC 9.8 06/28/2016   HGB 11.7 (L) 06/28/2016   HCT 35.2 (L) 06/28/2016   MCV 85.9 06/28/2016   PLT 215 06/28/2016     Anesthesia Physical Anesthesia Plan  ASA: II  Anesthesia Plan: Epidural   Post-op Pain Management:    Induction:   Airway Management Planned:   Additional Equipment:   Intra-op Plan:   Post-operative Plan:   Informed Consent: I have reviewed the patients History and Physical, chart, labs and discussed the procedure including the risks, benefits and alternatives for the proposed anesthesia with the patient or authorized representative who has indicated his/her understanding and acceptance.     Plan Discussed with:   Anesthesia Plan Comments:         Anesthesia Quick Evaluation

## 2016-06-28 NOTE — Anesthesia Procedure Notes (Signed)
Epidural Patient location during procedure: OB Start time: 06/28/2016 11:32 AM End time: 06/28/2016 11:38 AM  Staffing Anesthesiologist: Shona Simpson D Performed: anesthesiologist   Preanesthetic Checklist Completed: patient identified, site marked, surgical consent, pre-op evaluation, timeout performed, IV checked, risks and benefits discussed and monitors and equipment checked  Epidural Patient position: sitting Prep: ChloraPrep Patient monitoring: heart rate, continuous pulse ox and blood pressure Approach: midline Location: L3-L4 Injection technique: LOR saline  Needle:  Needle type: Tuohy  Needle gauge: 17 G Needle length: 9 cm Catheter type: closed end flexible Catheter size: 20 Guage Test dose: negative and 1.5% lidocaine  Assessment Events: blood not aspirated, injection not painful, no injection resistance and no paresthesia  Additional Notes LOR @ 5  Patient identified. Risks/Benefits/Options discussed with patient including but not limited to bleeding, infection, nerve damage, paralysis, failed block, incomplete pain control, headache, blood pressure changes, nausea, vomiting, reactions to medications, itching and postpartum back pain. Confirmed with bedside nurse the patient's most recent platelet count. Confirmed with patient that they are not currently taking any anticoagulation, have any bleeding history or any family history of bleeding disorders. Patient expressed understanding and wished to proceed. All questions were answered. Sterile technique was used throughout the entire procedure. Please see nursing notes for vital signs. Test dose was given through epidural catheter and negative prior to continuing to dose epidural or start infusion. Warning signs of high block given to the patient including shortness of breath, tingling/numbness in hands, complete motor block, or any concerning symptoms with instructions to call for help. Patient was given instructions on  fall risk and not to get out of bed. All questions and concerns addressed with instructions to call with any issues or inadequate analgesia.    Reason for block:procedure for pain

## 2016-06-28 NOTE — H&P (Signed)
Margaret Logan is a 26 y.o. female  G3P2002  weeks 4 days presents for elective IOL at term.  She reports irregular mild ctx, no LOF, No VB, reports good FM.  Her prenatal course is complicated.   OB History    Gravida Para Term Preterm AB Living   SAB TAB Ectopic Multiple Live Births   1     0 2     Past Medical History:  Diagnosis Date  . Asthma    uses an inhaler prn, last use about a year ago  . Headache    Past Surgical History:  Procedure Laterality Date  . NO PAST SURGERIES     Family History: family history includes Hypertension in her maternal grandmother and mother. Social History:  reports that she has never smoked. She has never used smokeless tobacco. She reports that she does not drink alcohol or use drugs.     Maternal Diabetes: No Genetic Screening: Normal Maternal Ultrasounds/Referrals: Normal Fetal Ultrasounds or other Referrals:  Other: Anatomy US normal Maternal Substance Abuse:  No Significant Maternal Medications:  None Significant Maternal Lab Results:  Lab values include: Group B Strep negative Other Comments:  None  Review of Systems  Constitutional: Negative.   HENT: Negative.   Eyes: Negative.   Respiratory: Negative.   Cardiovascular: Negative.   Skin: Negative.   All other systems reviewed and are negative.  Maternal Medical History:  Contractions: Onset was 13-24 hours ago.   Frequency: rare.   Perceived severity is mild.    Fetal activity: Perceived fetal activity is normal.   Last perceived fetal movement was greater than 24 hours ago.    Prenatal complications: no prenatal complications Prenatal Complications - Diabetes: none.    Dilation: 2 Effacement (%): 60 Station: -3 Exam by:: Lytle Michaels RN Blood pressure (!) 100/57, pulse 97, temperature 98.1 F (36.7 C), temperature source Oral, resp. rate 18, height  (1.626 m), weight 89.8 kg (198 lb), last menstrual period 08/24/2015, SpO2 100 %, unknown  if currently breastfeeding. Maternal Exam:  Uterine Assessment: Contraction frequency is rare.   Abdomen: Patient reports no abdominal tenderness. Fundal height is 39 cm.   Estimated fetal weight is 3200 grams.   Fetal presentation: vertex  Introitus: Normal vulva. Normal vagina.  Ferning test: not done.  Nitrazine test: not done. Amniotic fluid character: not assessed.  Pelvis: adequate for delivery.      Fetal Exam Fetal Monitor Review: Baseline rate: 140.  Variability: moderate (6-25 bpm).   Pattern: accelerations present and no decelerations.    Fetal State Assessment: Category I - tracings are normal.     Physical Exam  Nursing note and vitals reviewed.   Prenatal labs: ABO, Rh: --/--/A POS (04/30 0140) Antibody: NEG (04/30 0140) Rubella: Immune (10/19 0000) RPR: Nonreactive (10/19 0000)  HBsAg: Negative (10/19 0000)  HIV: Non-reactive (10/19 0000)  GBS: Negative (03/29 0000)   Assessment/Plan: 26 yo G3P2 SIUP at 39 weeks 4 days for elective IOL at term.   She has a h/o severe shoulder dystocia with her last baby (8#5oz) Admit to L&D Misoprostol for cervical ripening then pitocin AROM for augmentation Epidural on demand Continuous EFM / TOCO  Margaret Logan Margaret Logan 06/28/2016, 8:13 AM

## 2016-06-28 NOTE — Lactation Note (Signed)
This note was copied from a baby's chart. Lactation Consultation Note  Patient Name: Margaret Logan [id number] Date: 06/28/2016 Reason for consult: Initial assessment Baby at 2 hr of life. Mom was requesting lactation but upon entry she was latching baby. She stated she just weaned her 2nd child. She bf both of her other children without any issues but wanted "someone to check the latch". Baby was sleepy at the breast and was gagging a little. Discussed baby behavior, feeding frequency, baby belly size, voids, wt loss, breast changes, and nipple care. Demonstrated manual expression, colostrum noted bilaterally, spoon in room. Given lactation handouts. Aware of OP services and support group.    Maternal Data Has patient been taught Hand Expression?: Yes Does the patient have breastfeeding experience prior to this delivery?: Yes  Feeding Feeding Type: Breast Fed Length of feed: 10 min  LATCH Score/Interventions Latch: Repeated attempts needed to sustain latch, nipple held in mouth throughout feeding, stimulation needed to elicit sucking reflex. Intervention(s): Adjust position;Assist with latch  Audible Swallowing: None Intervention(s): Skin to skin;Hand expression  Type of Nipple: Everted at rest and after stimulation  Comfort (Breast/Nipple): Soft / non-tender     Hold (Positioning): Assistance needed to correctly position infant at breast and maintain latch. Intervention(s): Support Pillows;Position options  LATCH Score: 6  Lactation Tools Discussed/Used WIC Program: No   Consult Status Consult Status: Follow-up Date: 06/29/16 Follow-up type: In-patient    Rulon Eisenmenger 06/28/2016, 10:44 PM

## 2016-06-29 LAB — CBC
HEMATOCRIT: 31.8 % — AB (ref 36.0–46.0)
HEMOGLOBIN: 10.7 g/dL — AB (ref 12.0–15.0)
MCH: 29.2 pg (ref 26.0–34.0)
MCHC: 33.6 g/dL (ref 30.0–36.0)
MCV: 86.9 fL (ref 78.0–100.0)
Platelets: 199 10*3/uL (ref 150–400)
RBC: 3.66 MIL/uL — ABNORMAL LOW (ref 3.87–5.11)
RDW: 14.7 % (ref 11.5–15.5)
WBC: 12.6 10*3/uL — ABNORMAL HIGH (ref 4.0–10.5)

## 2016-06-29 MED ORDER — OXYCODONE HCL 5 MG PO TABS
5.0000 mg | ORAL_TABLET | ORAL | Status: DC | PRN
  Administered 2016-06-29 (×2): 5 mg via ORAL
  Filled 2016-06-29 (×2): qty 1

## 2016-06-29 NOTE — Progress Notes (Signed)
Pt c/o intermittent cramping unrelieved by tylenol and motrin and requests stronger pain medication. Encouraged pt to address with rounding MD. Spoke with Dr. Dareen Piano on unit after he rounded on pt who stated that pt did not mention to him pain medication but that RN can "order whatever you want." Asked Dr. Dareen Piano for verbal order for 5 mg oxycodone IR q4hrs prn and explained to him that RN must have order from MD. Order received.

## 2016-06-29 NOTE — Anesthesia Postprocedure Evaluation (Signed)
Anesthesia Post Note  Patient: Margaret Logan  Procedure(s) Performed: * No procedures listed *  Patient location during evaluation: Mother Baby Anesthesia Type: Epidural Level of consciousness: awake and alert and oriented Pain management: satisfactory to patient Vital Signs Assessment: post-procedure vital signs reviewed and stable Respiratory status: spontaneous breathing and nonlabored ventilation Cardiovascular status: stable Postop Assessment: no headache, no backache, no signs of nausea or vomiting, adequate PO intake and patient able to bend at knees (patient up walking) Anesthetic complications: no        Last Vitals:  Vitals:   06/28/16 2331 06/29/16 0335  BP: (!) 114/59 (!) 94/56  Pulse: 91 82  Resp: 18 17  Temp: 37.1 C 36.7 C    Last Pain:  Vitals:   06/29/16 0512  TempSrc:   PainSc: 5    Pain Goal: Patients Stated Pain Goal: 8 (06/28/16 0730)               Madison Hickman

## 2016-06-29 NOTE — Progress Notes (Signed)
UR chart review completed.  

## 2016-06-29 NOTE — Lactation Note (Signed)
This note was copied from a baby's chart. Lactation Consultation Note  Patient Name: Margaret Logan Date: 06/29/2016 Reason for consult: Follow-up assessment Mom experienced BF and reports baby is nursing well, denies questions/concerns. Advised to continue to BF with feeding ques, 8-12 times or more in 24 hours. Encouraged to call for assist as needed or questions/concerns.   Maternal Data    Feeding    LATCH Score/Interventions                      Lactation Tools Discussed/Used     Consult Status Consult Status: PRN Date: 06/30/16 Follow-up type: In-patient    Alfred Levins 06/29/2016, 9:38 AM

## 2016-06-29 NOTE — Progress Notes (Signed)
PPD#1  Pt doing well. Lochia wnl VSSAF IMP/ Stable Plan/ Routine PP care

## 2016-06-30 NOTE — Discharge Summary (Signed)
Obstetric Discharge Summary Reason for Admission: induction of labor Prenatal Procedures: ultrasound Intrapartum Procedures: spontaneous vaginal delivery Postpartum Procedures: none Complications-Operative and Postpartum: none Hemoglobin  Date Value Ref Range Status  06/29/2016 10.7 (L) 12.0 - 15.0 g/dL Final   HCT  Date Value Ref Range Status  06/29/2016 31.8 (L) 36.0 - 46.0 % Final    Physical Exam:  General: alert and cooperative Lochia: appropriate Uterine Fundus: firm DVT Evaluation: No evidence of DVT seen on physical exam.  Discharge Diagnoses: Term Pregnancy-delivered  Discharge Information: Date: 06/30/2016 Activity: pelvic rest Diet: routine Medications: PNV and Ibuprofen Condition: stable Instructions: refer to practice specific booklet Discharge to: home Follow-up Information    Logan, Margaret Mae, MD Follow up in 4 week(s).   Specialty:  Obstetrics and Gynecology Contact information: [address] [place] [address] [phone]           Newborn Data: Live born female  Birth Weight: 7 lb 15 oz (3600 g) APGAR: 9, 9  Home with mother.  Margaret Logan 06/30/2016, 11:12 AM

## 2016-06-30 NOTE — Lactation Note (Signed)
This note was copied from a baby's chart. Lactation Consultation Note  Mother reports BF well and denies questions for lactation services. Reminded of support groups and OP services.  Patient Name: Margaret Logan [id number] Date: 06/30/2016     Maternal Data    Feeding Feeding Type: Breast Fed Length of feed: 15 min  LATCH Score/Interventions Latch: Grasps breast easily, tongue down, lips flanged, rhythmical sucking.  Audible Swallowing: Spontaneous and intermittent  Type of Nipple: Everted at rest and after stimulation  Comfort (Breast/Nipple): Filling, red/small blisters or bruises, mild/mod discomfort  Problem noted: Mild/Moderate discomfort (and filling)  Hold (Positioning): No assistance needed to correctly position infant at breast.  LATCH Score: 9  Lactation Tools Discussed/Used     Consult Status      Soyla Dryer 06/30/2016, 9:24 AM

## 2016-06-30 NOTE — Plan of Care (Signed)
Problem: Education: Goal: Knowledge of condition will improve Discharge education reviewed with patient and significant other. Patient verbalizes understanding.    

## 2018-08-03 IMAGING — US US MFM OB COMP +14 WKS
1 series · 14 of 28 positions shown · non-contrast
Comparison: none

[Series 1: us mfm ob comp +14 wks · 14 of 69 slices shown]
[im 3/69]
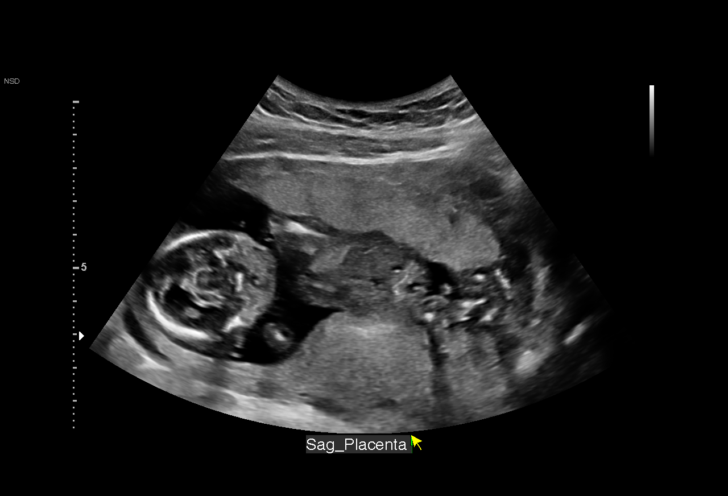
[im 8/69]
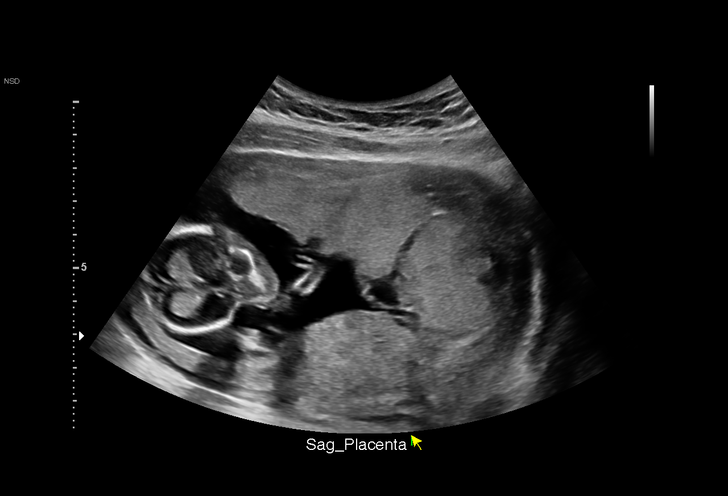
[im 13/69]
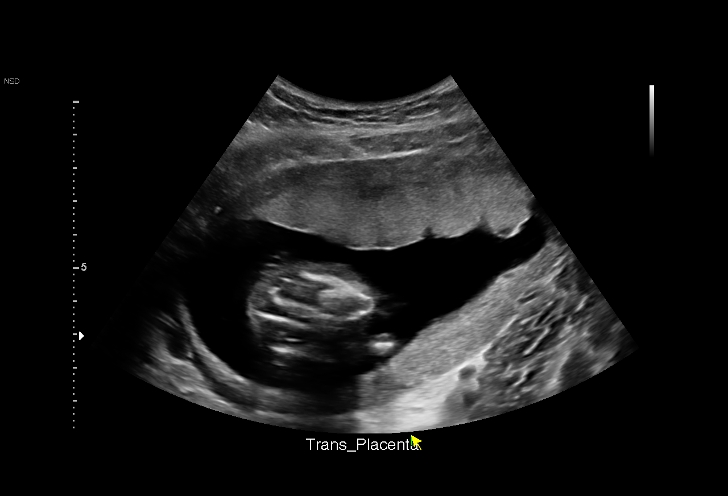
[im 18/69]
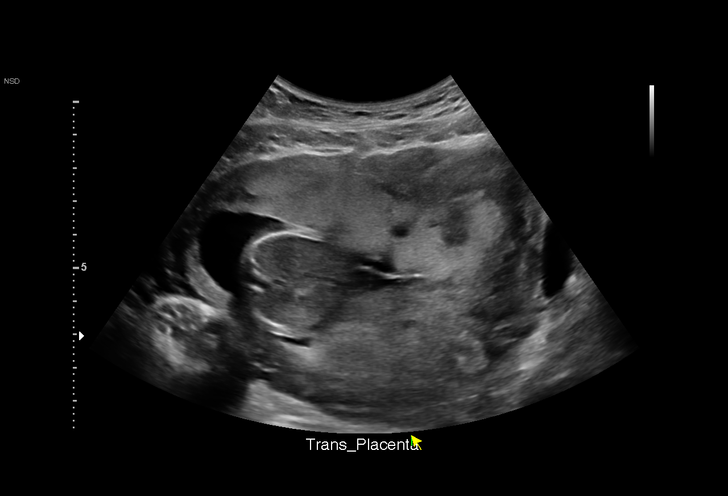
[im 23/69]
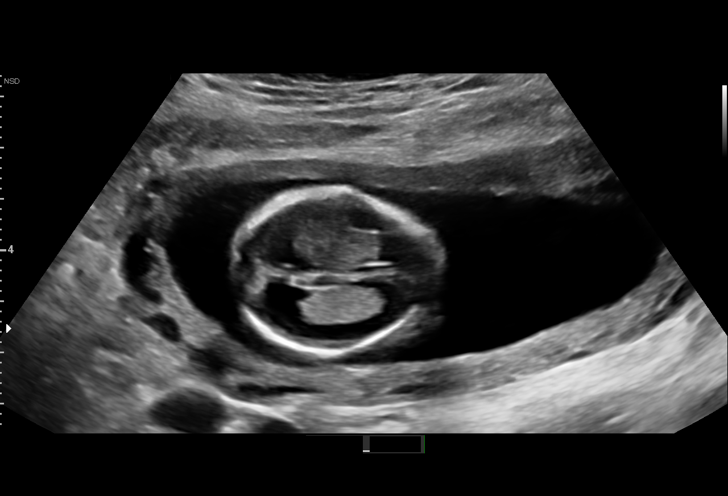
[im 28/69]
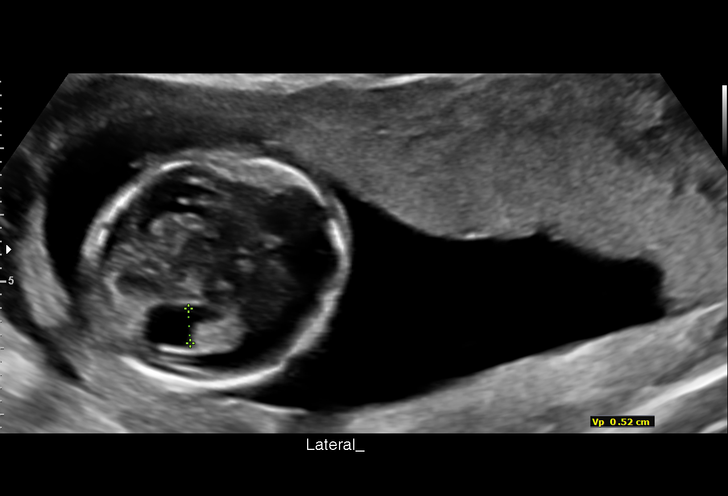
[im 33/69]
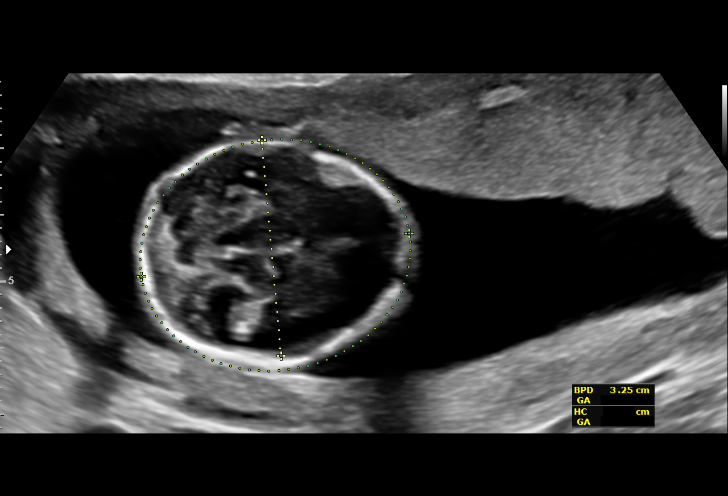
[im 38/69]
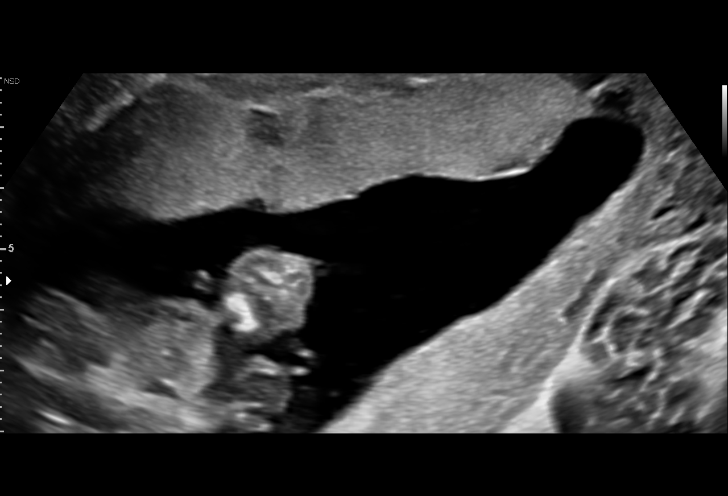
[im 43/69]
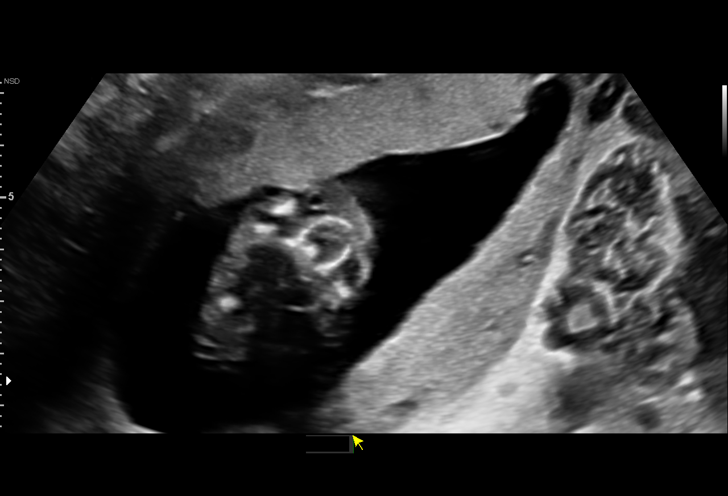
[im 48/69]
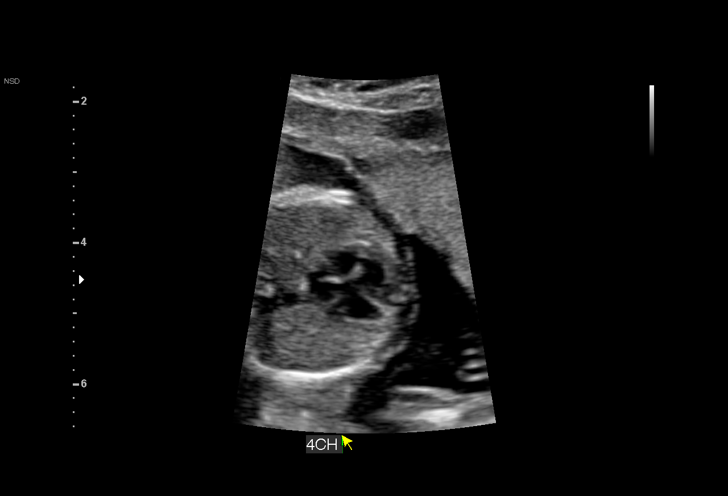
[im 53/69]
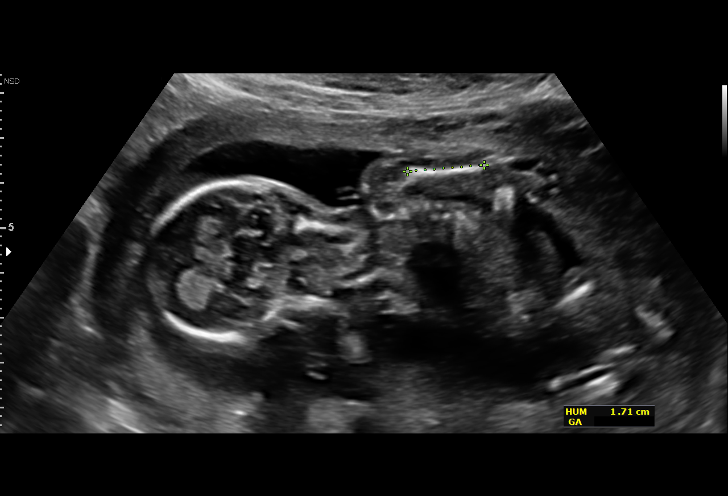
[im 58/69]
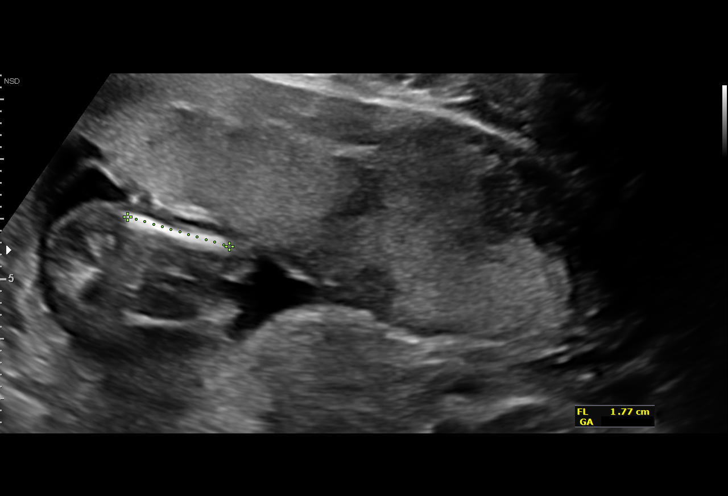
[im 63/69]
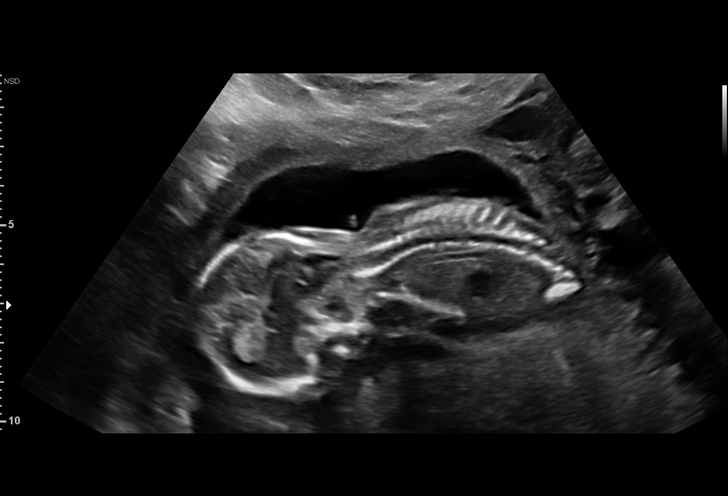
[im 69/69]
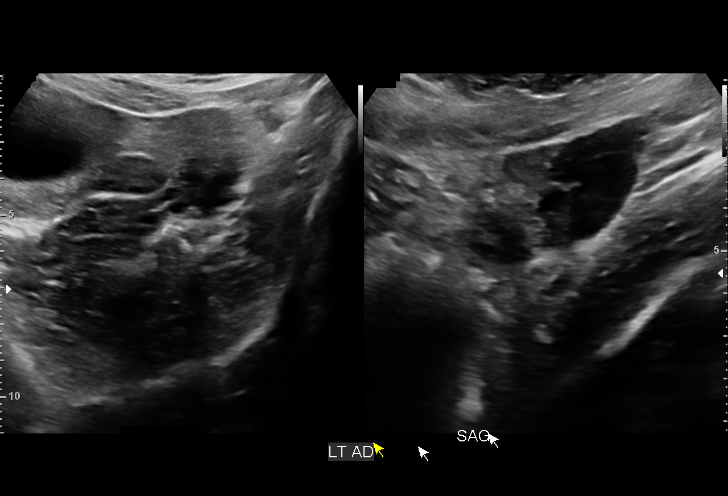

[14 of 28 positions shown; findings below may reference images not displayed]

HUNG CHING DO

1  WISLIN DONPAULO          [id number]      [id number]     [id number]
Indications

15 weeks gestation of pregnancy
Abnormal biochemical screen (Low Fetal
Fraction)
OB History

Gravidity:    4         Term:   2         SAB:   1
Living:       2
Fetal Evaluation

Num Of Fetuses:     1
Fetal Heart         132
Rate(bpm):
Cardiac Activity:   Observed
Presentation:       Breech
Placenta:           Anterior Fundal, above cervical os
P. Cord Insertion:  Visualized

Amniotic Fluid
AFI FV:      Subjectively within normal limits
Biometry

BPD:      32.5  mm     G. Age:  16w 1d         66  %    CI:         75.4   %    70 - 86
FL/HC:      14.6   %    13.3 -
HC:      118.7  mm     G. Age:  15w 6d         44  %    HC/AC:      1.21        1.05 -
AC:       98.2  mm     G. Age:  15w 6d         58  %    FL/BPD:     53.2   %
FL:       17.3  mm     G. Age:  15w 1d         23  %    FL/AC:      17.6   %    20 - 24
HUM:      16.8  mm     G. Age:  14w 6d         24  %
CER:      14.5  mm     G. Age:  14w 4d         23  %
NFT:       3.1  mm
CM:        1.7  mm
Est. FW:     128  gm      0 lb 5 oz     67  %
Gestational Age

LMP:           20w 3d        Date:  08/24/15                 EDD:   05/30/16
U/S Today:     15w 5d                                        EDD:   07/02/16
Best:          15w 5d     Det. By:  U/S (01/14/16)           EDD:   07/02/16
Anatomy

Cranium:               Appears normal         Aortic Arch:            Not well visualized
Cavum:                 Appears normal         Ductal Arch:            Not well visualized
Ventricles:            Appears normal         Diaphragm:              Appears normal
Choroid Plexus:        Appears normal         Stomach:                Previously Seen
Cerebellum:            Appears normal         Abdomen:                Previously seen
Posterior Fossa:       Appears normal         Abdominal Wall:         Previously seen
Nuchal Fold:           Appears normal         Cord Vessels:           Not well visualized
Face:                  Appears normal         Kidneys:                Not well visualized
(orbits and profile)
Lips:                  Appears normal         Bladder:                Appears normal
Thoracic:              Appears normal         Spine:                  Not well visualized
Heart:                 Appears normal         Upper Extremities:      Visualized
(4CH, axis, and situs
RVOT:                  Not well visualized    Lower Extremities:      Visualized
LVOT:                  Not well visualized
Cervix Uterus Adnexa

Cervix
Normal appearance by transabdominal scan.

Uterus
Normal shape and size.

Left Ovary
Within normal limits.

Right Ovary
Within normal limits.

Cul De Sac:   No free fluid seen.
Impression

Singleton intrauterine pregnancy at 15+5 weeks, low fetal
fraction on NIPT x 2
Review of the anatomy shows no sonographic markers for
aneuploidy or structural anomalies
However, evaluations should be considered suboptimal
secondary to early gestational age and fetal position
Amniotic fluid volume is normal
Estimated fetal weight is 128g which is growth in the 67th
percentile
Recommendations

To see Genetic Counselor today. No significant risk for
aneuploidy see. Recommend repeat scan in 6 weeks to
complete anatomic survey

## 2024-01-20 ENCOUNTER — Other Ambulatory Visit: Payer: Self-pay

## 2024-01-20 ENCOUNTER — Emergency Department (HOSPITAL_BASED_OUTPATIENT_CLINIC_OR_DEPARTMENT_OTHER)
Admission: EM | Admit: 2024-01-20 | Discharge: 2024-01-20 | Disposition: A | Discharge status: Home / Self Care | Attending: Emergency Medicine | Admitting: Emergency Medicine

## 2024-01-20 ENCOUNTER — Encounter (HOSPITAL_BASED_OUTPATIENT_CLINIC_OR_DEPARTMENT_OTHER): Payer: Self-pay | Admitting: Emergency Medicine

## 2024-01-20 DIAGNOSIS — R112 Nausea with vomiting, unspecified: Secondary | ICD-10-CM

## 2024-01-20 DIAGNOSIS — O219 Vomiting of pregnancy, unspecified: Secondary | ICD-10-CM | POA: Insufficient documentation

## 2024-01-20 DIAGNOSIS — Z91040 Latex allergy status: Secondary | ICD-10-CM | POA: Insufficient documentation

## 2024-01-20 DIAGNOSIS — R197 Diarrhea, unspecified: Secondary | ICD-10-CM | POA: Diagnosis not present

## 2024-01-20 DIAGNOSIS — Z3A01 Less than 8 weeks gestation of pregnancy: Secondary | ICD-10-CM | POA: Diagnosis not present

## 2024-01-20 DIAGNOSIS — Z3201 Encounter for pregnancy test, result positive: Secondary | ICD-10-CM

## 2024-01-20 LAB — COMPREHENSIVE METABOLIC PANEL WITH GFR
ALT: 21 U/L (ref 0–44)
AST: 26 U/L (ref 15–41)
Albumin: 4.7 g/dL (ref 3.5–5.0)
Alkaline Phosphatase: 61 U/L (ref 38–126)
Anion gap: 13 (ref 5–15)
BUN: 10 mg/dL (ref 6–20)
CO2: 22 mmol/L (ref 22–32)
Calcium: 9.2 mg/dL (ref 8.9–10.3)
Chloride: 104 mmol/L (ref 98–111)
Creatinine, Ser: 0.76 mg/dL (ref 0.44–1.00)
GFR, Estimated: >60 mL/min (ref >60)
Glucose, Bld: 103 mg/dL — ABNORMAL HIGH (ref 70–99)
Potassium: 3.9 mmol/L (ref 3.5–5.1)
Sodium: 139 mmol/L (ref 135–145)
Total Bilirubin: 0.9 mg/dL (ref 0.0–1.2)
Total Protein: 7.8 g/dL (ref 6.5–8.1)

## 2024-01-20 LAB — CBC WITH DIFFERENTIAL/PLATELET
Abs Immature Granulocytes: 0.03 K/uL (ref 0.00–0.07)
Basophils Absolute: 0 K/uL (ref 0.0–0.1)
Basophils Relative: 0 %
Eosinophils Absolute: 0.1 K/uL (ref 0.0–0.5)
Eosinophils Relative: 1 %
HCT: 37.8 % (ref 36.0–46.0)
Hemoglobin: 12.7 g/dL (ref 12.0–15.0)
Immature Granulocytes: 0 %
Lymphocytes Relative: 10 %
Lymphs Abs: 0.9 K/uL (ref 0.7–4.0)
MCH: 30.5 pg (ref 26.0–34.0)
MCHC: 33.6 g/dL (ref 30.0–36.0)
MCV: 90.6 fL (ref 80.0–100.0)
Monocytes Absolute: 0.5 K/uL (ref 0.1–1.0)
Monocytes Relative: 6 %
Neutro Abs: 7.4 K/uL (ref 1.7–7.7)
Neutrophils Relative %: 83 %
Platelets: 276 K/uL (ref 150–400)
RBC: 4.17 MIL/uL (ref 3.87–5.11)
RDW: 12.9 % (ref 11.5–15.5)
WBC: 9 K/uL (ref 4.0–10.5)
nRBC: 0 % (ref 0.0–0.2)

## 2024-01-20 LAB — HCG, SERUM, QUALITATIVE: Preg, Serum: POSITIVE — AB

## 2024-01-20 LAB — HCG, QUANTITATIVE, PREGNANCY: hCG, Beta Chain, Quant, S: 232 m[IU]/mL — ABNORMAL HIGH (ref <5)

## 2024-01-20 LAB — LIPASE, BLOOD: Lipase: 27 U/L (ref 11–51)

## 2024-01-20 MED ORDER — ONDANSETRON 4 MG PO TBDP: 4.0000 mg | ORAL_TABLET | Freq: Three times a day (TID) | ORAL | 0 refills | Status: AC | PRN

## 2024-01-20 MED ORDER — SODIUM CHLORIDE 0.9 % IV BOLUS
1000.0000 mL | Freq: Once | INTRAVENOUS | Status: AC
  Administered 2024-01-20: 1000 mL via INTRAVENOUS

## 2024-01-20 MED ORDER — ONDANSETRON HCL 4 MG/2ML IJ SOLN
4.0000 mg | Freq: Once | INTRAMUSCULAR | Status: AC
  Administered 2024-01-20: 4 mg via INTRAVENOUS
  Filled 2024-01-20: qty 2

## 2024-01-20 MED ORDER — ONDANSETRON 4 MG PO TBDP: 4.0000 mg | ORAL_TABLET | Freq: Three times a day (TID) | ORAL | 0 refills | Status: DC | PRN
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
